# Patient Record
Sex: Male | Born: 1940 | Race: White | Hispanic: No | Marital: Married | Smoking: Former smoker
Health system: Southern US, Community
[De-identification: ages and names within clinical notes are randomized; demographics above are authoritative.]

## PROBLEM LIST (undated history)

## (undated) DIAGNOSIS — I10 Essential (primary) hypertension: Secondary | ICD-10-CM

## (undated) DIAGNOSIS — R32 Unspecified urinary incontinence: Secondary | ICD-10-CM

## (undated) DIAGNOSIS — Z87898 Personal history of other specified conditions: Secondary | ICD-10-CM

## (undated) DIAGNOSIS — M1990 Unspecified osteoarthritis, unspecified site: Secondary | ICD-10-CM

## (undated) DIAGNOSIS — IMO0001 Reserved for inherently not codable concepts without codable children: Secondary | ICD-10-CM

## (undated) DIAGNOSIS — G250 Essential tremor: Secondary | ICD-10-CM

## (undated) DIAGNOSIS — R51 Headache: Secondary | ICD-10-CM

## (undated) DIAGNOSIS — C61 Malignant neoplasm of prostate: Secondary | ICD-10-CM

## (undated) DIAGNOSIS — G4733 Obstructive sleep apnea (adult) (pediatric): Secondary | ICD-10-CM

## (undated) DIAGNOSIS — E785 Hyperlipidemia, unspecified: Secondary | ICD-10-CM

## (undated) DIAGNOSIS — IMO0002 Reserved for concepts with insufficient information to code with codable children: Secondary | ICD-10-CM

## (undated) DIAGNOSIS — I629 Nontraumatic intracranial hemorrhage, unspecified: Secondary | ICD-10-CM

## (undated) DIAGNOSIS — R600 Localized edema: Secondary | ICD-10-CM

## (undated) DIAGNOSIS — R519 Headache, unspecified: Secondary | ICD-10-CM

## (undated) HISTORY — DX: Hyperlipidemia, unspecified: E78.5

## (undated) HISTORY — DX: Essential (primary) hypertension: I10

## (undated) HISTORY — DX: Nontraumatic intracranial hemorrhage, unspecified: I62.9

## (undated) HISTORY — DX: Reserved for inherently not codable concepts without codable children: IMO0001

## (undated) HISTORY — DX: Unspecified osteoarthritis, unspecified site: M19.90

## (undated) HISTORY — DX: Reserved for concepts with insufficient information to code with codable children: IMO0002

## (undated) HISTORY — DX: Obstructive sleep apnea (adult) (pediatric): G47.33

## (undated) HISTORY — PX: OTHER SURGICAL HISTORY: SHX169

---

## 2009-05-20 ENCOUNTER — Ambulatory Visit (HOSPITAL_COMMUNITY): Admission: RE | Admit: 2009-05-20 | Discharge: 2009-05-20 | Payer: Self-pay | Admitting: Urology

## 2009-06-30 ENCOUNTER — Ambulatory Visit (HOSPITAL_COMMUNITY)
Admission: RE | Admit: 2009-06-30 | Discharge: 2009-06-30 | Payer: Self-pay | Source: Home / Self Care | Admitting: Urology

## 2010-01-18 DIAGNOSIS — I629 Nontraumatic intracranial hemorrhage, unspecified: Secondary | ICD-10-CM

## 2010-01-18 HISTORY — DX: Nontraumatic intracranial hemorrhage, unspecified: I62.9

## 2010-01-27 ENCOUNTER — Inpatient Hospital Stay (HOSPITAL_COMMUNITY)
Admission: EM | Admit: 2010-01-27 | Discharge: 2010-01-30 | Payer: Self-pay | Attending: Internal Medicine | Admitting: Internal Medicine

## 2010-01-27 ENCOUNTER — Emergency Department (HOSPITAL_COMMUNITY)
Admission: EM | Admit: 2010-01-27 | Discharge: 2010-01-27 | Payer: Self-pay | Source: Home / Self Care | Admitting: Emergency Medicine

## 2010-01-29 ENCOUNTER — Encounter (INDEPENDENT_AMBULATORY_CARE_PROVIDER_SITE_OTHER): Payer: Self-pay | Admitting: Internal Medicine

## 2010-01-30 ENCOUNTER — Ambulatory Visit: Payer: Self-pay | Admitting: Internal Medicine

## 2010-01-30 DIAGNOSIS — I4891 Unspecified atrial fibrillation: Secondary | ICD-10-CM

## 2010-02-02 LAB — DIFFERENTIAL
Basophils Absolute: 0 10*3/uL (ref 0.0–0.1)
Basophils Absolute: 0 10*3/uL (ref 0.0–0.1)
Basophils Absolute: 0 10*3/uL (ref 0.0–0.1)
Basophils Relative: 0 % (ref 0–1)
Basophils Relative: 0 % (ref 0–1)
Basophils Relative: 0 % (ref 0–1)
Eosinophils Absolute: 0 10*3/uL (ref 0.0–0.7)
Eosinophils Absolute: 0 10*3/uL (ref 0.0–0.7)
Eosinophils Absolute: 0 10*3/uL (ref 0.0–0.7)
Eosinophils Relative: 0 % (ref 0–5)
Eosinophils Relative: 0 % (ref 0–5)
Eosinophils Relative: 0 % (ref 0–5)
Lymphocytes Relative: 16 % (ref 12–46)
Lymphocytes Relative: 14 % (ref 12–46)
Lymphocytes Relative: 16 % (ref 12–46)
Lymphs Abs: 1.5 10*3/uL (ref 0.7–4.0)
Lymphs Abs: 1.2 10*3/uL (ref 0.7–4.0)
Lymphs Abs: 1.5 10*3/uL (ref 0.7–4.0)
Monocytes Absolute: 1.1 10*3/uL — ABNORMAL HIGH (ref 0.1–1.0)
Monocytes Absolute: 0.8 10*3/uL (ref 0.1–1.0)
Monocytes Absolute: 0.7 10*3/uL (ref 0.1–1.0)
Monocytes Relative: 12 % (ref 3–12)
Monocytes Relative: 8 % (ref 3–12)
Monocytes Relative: 7 % (ref 3–12)
Neutro Abs: 6.6 10*3/uL (ref 1.7–7.7)
Neutro Abs: 6.9 10*3/uL (ref 1.7–7.7)
Neutro Abs: 6.9 10*3/uL (ref 1.7–7.7)
Neutrophils Relative %: 72 % (ref 43–77)
Neutrophils Relative %: 77 % (ref 43–77)
Neutrophils Relative %: 76 % (ref 43–77)

## 2010-02-02 LAB — CBC
HCT: 42.7 % (ref 39.0–52.0)
HCT: 42.6 % (ref 39.0–52.0)
HCT: 41.2 % (ref 39.0–52.0)
Hemoglobin: 15.1 g/dL (ref 13.0–17.0)
Hemoglobin: 14.7 g/dL (ref 13.0–17.0)
Hemoglobin: 14.4 g/dL (ref 13.0–17.0)
MCH: 32.3 pg (ref 26.0–34.0)
MCH: 32.1 pg (ref 26.0–34.0)
MCH: 32.5 pg (ref 26.0–34.0)
MCHC: 35.4 g/dL (ref 30.0–36.0)
MCHC: 34.5 g/dL (ref 30.0–36.0)
MCHC: 35 g/dL (ref 30.0–36.0)
MCV: 91.2 fL (ref 78.0–100.0)
MCV: 93 fL (ref 78.0–100.0)
MCV: 93 fL (ref 78.0–100.0)
Platelets: 256 10*3/uL (ref 150–400)
Platelets: 237 10*3/uL (ref 150–400)
Platelets: 231 10*3/uL (ref 150–400)
RBC: 4.68 MIL/uL (ref 4.22–5.81)
RBC: 4.58 MIL/uL (ref 4.22–5.81)
RBC: 4.43 MIL/uL (ref 4.22–5.81)
RDW: 13.1 % (ref 11.5–15.5)
RDW: 13.1 % (ref 11.5–15.5)
RDW: 13 % (ref 11.5–15.5)
WBC: 9.2 10*3/uL (ref 4.0–10.5)
WBC: 8.9 10*3/uL (ref 4.0–10.5)
WBC: 9 10*3/uL (ref 4.0–10.5)

## 2010-02-02 LAB — BASIC METABOLIC PANEL
BUN: 12 mg/dL (ref 6–23)
BUN: 12 mg/dL (ref 6–23)
CO2: 28 mEq/L (ref 19–32)
CO2: 27 mEq/L (ref 19–32)
Calcium: 9.4 mg/dL (ref 8.4–10.5)
Calcium: 8.9 mg/dL (ref 8.4–10.5)
Chloride: 104 mEq/L (ref 96–112)
Chloride: 102 mEq/L (ref 96–112)
Creatinine, Ser: 0.98 mg/dL (ref 0.4–1.5)
Creatinine, Ser: 0.86 mg/dL (ref 0.4–1.5)
GFR calc Af Amer: >60 mL/min (ref >60)
GFR calc Af Amer: >60 mL/min (ref >60)
GFR calc non Af Amer: >60 mL/min (ref >60)
GFR calc non Af Amer: >60 mL/min (ref >60)
Glucose, Bld: 113 mg/dL — ABNORMAL HIGH (ref 70–99)
Glucose, Bld: 108 mg/dL — ABNORMAL HIGH (ref 70–99)
Potassium: 3.7 mEq/L (ref 3.5–5.1)
Potassium: 3.6 mEq/L (ref 3.5–5.1)
Sodium: 140 mEq/L (ref 135–145)
Sodium: 138 mEq/L (ref 135–145)

## 2010-02-02 LAB — COMPREHENSIVE METABOLIC PANEL
ALT: 17 U/L (ref 0–53)
AST: 17 U/L (ref 0–37)
Albumin: 3.6 g/dL (ref 3.5–5.2)
Alkaline Phosphatase: 65 U/L (ref 39–117)
BUN: 11 mg/dL (ref 6–23)
CO2: 29 mEq/L (ref 19–32)
Calcium: 9 mg/dL (ref 8.4–10.5)
Chloride: 105 mEq/L (ref 96–112)
Creatinine, Ser: 0.85 mg/dL (ref 0.4–1.5)
GFR calc Af Amer: >60 mL/min (ref >60)
GFR calc non Af Amer: >60 mL/min (ref >60)
Glucose, Bld: 121 mg/dL — ABNORMAL HIGH (ref 70–99)
Potassium: 4 mEq/L (ref 3.5–5.1)
Sodium: 141 mEq/L (ref 135–145)
Total Bilirubin: 0.6 mg/dL (ref 0.3–1.2)
Total Protein: 6.7 g/dL (ref 6.0–8.3)

## 2010-02-02 LAB — PROTIME-INR
INR: 0.94 (ref 0.00–1.49)
Prothrombin Time: 12.8 seconds (ref 11.6–15.2)

## 2010-02-02 LAB — T4, FREE: Free T4: 0.95 ng/dL (ref 0.80–1.80)

## 2010-02-02 LAB — PHOSPHORUS: Phosphorus: 3.1 mg/dL (ref 2.3–4.6)

## 2010-02-02 LAB — LIPID PANEL
Cholesterol: 195 mg/dL (ref 0–200)
HDL: 40 mg/dL (ref >39)
LDL Cholesterol: 138 mg/dL — ABNORMAL HIGH (ref 0–99)
Total CHOL/HDL Ratio: 4.9 RATIO
Triglycerides: 84 mg/dL (ref <150)
VLDL: 17 mg/dL (ref 0–40)

## 2010-02-02 LAB — HEMOGLOBIN A1C
Hgb A1c MFr Bld: 5.7 % — ABNORMAL HIGH (ref <5.7)
Mean Plasma Glucose: 117 mg/dL — ABNORMAL HIGH (ref <117)

## 2010-02-02 LAB — APTT: aPTT: 36 seconds (ref 24–37)

## 2010-02-02 LAB — TSH: TSH: 3.72 u[IU]/mL (ref 0.350–4.500)

## 2010-02-02 LAB — MAGNESIUM: Magnesium: 2.2 mg/dL (ref 1.5–2.5)

## 2010-02-02 LAB — MRSA PCR SCREENING: MRSA by PCR: NEGATIVE

## 2010-02-05 ENCOUNTER — Encounter: Payer: Self-pay | Admitting: Internal Medicine

## 2010-02-11 NOTE — Consult Note (Addendum)
NAMEGARRY, [name]                ACCOUNT NO.:  [id number]  MEDICAL RECORD NO.:  [id number]          PATIENT TYPE:  INP  LOCATION:  2610                         FACILITY:  [facility]  PHYSICIAN:  Joycelyn Schmid, MD   DATE OF BIRTH:  26-Oct-1940  DATE OF CONSULTATION: DATE OF DISCHARGE:                                CONSULTATION   CHIEF COMPLAINT/REASON FOR CONSULTATION:  Right occipital intracerebral hemorrhage, headache, visual difficulty.  HISTORY OF PRESENT ILLNESS:  A 70 year old male with no known past medical history who developed new onset right frontal headache and visual difficulty on January 26, 2010 at 9:30 in the morning.  He experienced increasingly severe right frontal headache and then noticed some abnormality of his vision.  He describes looking at objects and only being able to see certain parts of the object and having to focus and look for other clues to correctly identify them.  He gives an example of the clock on the wall in the hospital room.  He states that he can see that it is a round object on the wall.  By its location and context, he knows that it is a clock, however, he is not able to see the finer details of that and readily identified as an analogue clock on the wall.  He also notes some difficulty with reading.  The patient has had similar headache 5 or 6 years ago as well as another similar headache 5 or 6 months ago.  Each of these headaches lasted a few days.  The patient is well-educated gentleman who works as the Chartered certified accountant. He was an avid Designer, multimedia and over time had improved his skills that he was able to quickly glance the puzzle and already begin to get insight on how to solve the puzzle.  However, he "lost this ability" several months ago.  PAST MEDICAL HISTORY:  Questionable hypertension not formally diagnosed.  PAST SURGICAL HISTORY:  Arthroscopic knee surgery.  ALLERGIES:  None.  MEDICATIONS:  None.  SOCIAL HISTORY:  Two  to three beers every 2-3 weeks.  Denies tobacco or illicit drugs.  FAMILY HISTORY:  None.  REVIEW OF SYSTEMS:  As per the HPI.  He does have some mild nausea, right frontal headache.  Denies chest pain, shortness of breath, abdominal pain, constipation, diarrhea, joint pain.  Denies numbness, weakness, tingling in the extremities or face.  PHYSICAL EXAMINATION:  VITAL SIGNS:  Blood pressure ranging from 149-170 over 70-80, pulse 84, 96% room air, respirations 19.  PHYSICAL EXAMINATION:  NEUROLOGIC:  He is awake and alert.  Language is fluent, compression is intact.  He has some difficulty in visual identification of objects.  He has some element of left neglect.  NIH stroke scale could be the picture.  He readily identifies the woman washing dishes on the right side.  He takes several moments to slowly gaze towards the left side of the picture.  Interestingly, he describes the scene through the window where there is a bush and a walkway around it as looking like a person's head lying on a pillow.  He is able to read words, however,  has some difficulty with sentences with the first word of the sentence on the extreme left side of the page.  He is able to identify colors.  Cranial nerve examination:  Pupils reactive from 3- 2 mm, decreased visual field, left homonymous hemianopsia.  Extraocular muscles are intact.  Facial sensation and strength symmetric.  Uvula is midline.  Shoulders symmetric.  Tongue is midline.  Motor examination: Normal bulk and tone.  He has a mixed postural and resting tremor in the right upper extremity greater than left upper extremity.  5/5 strength in upper and lower extremities.  Sensory examination is intact to light touch.  No extinction to double simultaneous stimulation.  Reflexes are 1+ in the upper and lower extremities.  Downgoing toes.  Coordination Testing:  Finger-nose-finger, fine finger movements are rapid and symmetric.  Gait is not  assessed.  LABORATORY TESTING:  Sodium 141, BUN 11, creatinine 0.85.  PT 12.8, INR 0.94, PTT 36, platelets 237,000.  CT scan of the head which I reviewed shows a 2.5 x 3.9 cm right occipital intracerebral hemorrhage.  ASSESSMENT AND RECOMMENDATIONS:  A 70 year old male with new-onset headache and visual disturbance found to have a right occipital intracerebral hemorrhage at [facility] and transferred to [facility] for further management.  Differential diagnosis would include hypertensive intracerebral hemorrhage, underlying vascular malformation, amyloid angiopathy, or underlying structural lesion.  RECOMMENDATIONS: 1. CT scan of the head this morning to evaluate for stability of     hemorrhage. 2. MRI of the brain with and without contrast when able to do so. 3. Continue blood pressure control, goal systolic blood pressure less     than 160.     Joycelyn Schmid, MD     VP/MEDQ  D:  01/28/2010  T:  01/28/2010  Job:  [id number]  Electronically Signed by Joycelyn Schmid  on 02/11/2010 02:59:10 PM

## 2010-02-12 NOTE — H&P (Addendum)
NAMEBRONTE, [name]                ACCOUNT NO.:  [id number]  MEDICAL RECORD NO.:  [id number]          PATIENT TYPE:  EMS  LOCATION:  ED                            FACILITY:  [facility]  PHYSICIAN:  Rock Nephew, MD       DATE OF BIRTH:  1940/01/28  DATE OF ADMISSION:  01/27/2010 DATE OF DISCHARGE:  LH                             HISTORY & PHYSICAL   PRIMARY CARE PHYSICIAN:  The patient has no primary care physician.  CHIEF COMPLAINT:  Headache.  CT of the head notes him to have intracranial hemorrhage.  HISTORY OF PRESENT ILLNESS:  A 70 year old male, who has no significant past medical history, only history of hemorrhoids,  comes in with chief complaint of headache.  The patient reported that his headache started approximately 9:30 a.m. on January 25, 2010.  The patient reported that he had vision changes where he could not differentiate objects very well and had trouble walking on his computer. His headache is steadily getting worse, 8/10 intensity.  He had problems seeing the TV.  He was brought into the hospital. The patient in the hospital had a CT of the head without contrast which showed acute intraparenchymal hemorrhage right occipital lobe with extension, high attenuation, subdural blood along the falx and free edges of the right tentorium. The patient's headache is actually located in the forehead, more on the right side. The patient also reports that he has had a headache like this one time about 5-6 years ago.  He had another headache like this about 6 months ago.  PAST MEDICAL HISTORY: 1. Some headaches. 2. Hemorrhoids.  SURGICAL HISTORY:  Arthroscopic surgery right knee.  SOCIAL HISTORY:  Nonsmoker.  He drinks about two to three beers about every 2 weeks.  Does not use any drugs.  He is a retired Hydrographic surveyor.  REVIEW OF SYSTEMS:  Headaches, vision changes.  No chest pain or shortness of breath.  He has had some nausea but no vomiting.  He has no abdominal pain.  No  constipation.  No diarrhea.  No burning on urination.  He has no pain in his leg.  He has no trouble moving his arms or legs.  HOME MEDICATIONS:  None.  ALLERGIES:  No known drug allergies.  PHYSICAL EXAMINATION:  VITAL SIGNS:  Temperature 98.4, blood pressure 153/102, pulse rate 83, respiratory rate 80, 95% saturation on room air. HEAD, EYES, EARS, NOSE AND THROAT:  Normocephalic, atraumatic.  Pupils equally round, reactive to light. CARDIOVASCULAR:  S1, S2. Regular rate rhythm.  No murmurs or rubs. LUNGS:  Clear to auscultation bilaterally.  No wheezes or rhonchi. ABDOMEN:  Soft, nontender, nondistended.  Bowel sounds positive.  No guarding or rebound tenderness. EXTREMITIES:  No lower extremity edema evident. NEUROLOGIC:  The patient is alert, awake, oriented x3.  Cranial nerves II-XII grossly intact.  No focal neurological deficits appreciated.  LABORATORY STUDIES:  CT of the head shows acute intraparenchymal hemorrhage right occipital lobe with extension, high attenuation, subdural blood along the falx and free edges of the right tentorium.  WBC 9.2, hemoglobin 15.1, hematocrit 42.7, MCV 91.2, platelets 256, neutrophils  72.  INR 0.94.  PT 12.8, PTT 36.  Sodium 140, potassium 3.7, chloride 104, bicarbonate 28, BUN 12, creatinine 0.98, glucose 113, calcium 9.4.  IMPRESSION AND PLAN:  A 70 year old male admitted for a spontaneous intracranial hemorrhage: 1. Spontaneous intracranial hemorrhage, etiology not clear.  The     patient will be admitted to step-down bed at [facility]     per recommendation of Dr. Joycelyn Schmid with whom I have spoken.     The patient will get neurologic checks every 2 hours.  The patient     also will get a noncontrast head CT tomorrow morning to look at the     evolution of the hemorrhage.  The patient will get labetalol 10 mg     IV q.1 h p.r.n. systolic blood pressure greater than 160 or     diastolic blood pressure greater than 110 as  those are the current     blood pressure goals for the patient. 2. Hypertension.  The patient is slightly hypertensive, most likely     physiologic after the hemorrhage and also secondary to the headache     and pain. 3. Headache.  Again, headache is most likely from the intracranial     hemorrhage.  The patient will get oxycodone, morphine p.r.n., and     Dilaudid p.r.n. for the pain. 4. Deep vein thrombosis prophylaxis.  The patient will be placed on     SCDs.  CODE STATUS:  I discussed code status with the patient, and the patient wishes to be a full code.  Again, when the patient arrives to [facility], [facility] hospitalists as well as [facility] will be notified  Please note, this is not a final document until it is electronically signed.     Rock Nephew, MD     NH/MEDQ  D:  01/27/2010  T:  01/27/2010  Job:  [id number]  Electronically Signed by Rock Nephew MD on 02/12/2010 10:20:29 AM

## 2010-02-17 NOTE — Discharge Summary (Signed)
[name], [name]                ACCOUNT NO.:  [id number]  MEDICAL RECORD NO.:  [id number]          PATIENT TYPE:  INP  LOCATION:  3028                         FACILITY:  [facility]  PHYSICIAN:  Lonia Blood, M.D.DATE OF BIRTH:  03-23-1940  DATE OF ADMISSION:  01/27/2010 DATE OF DISCHARGE:  01/30/2010                        DISCHARGE SUMMARY - REFERRING   PRIMARY NEUROLOGIST PRIOR TO ADMISSION:  Dr. Josie Dixon with [facility].  NEUROLOGIST THIS ADMISSION:  Dr. Delia Heady.  DISCHARGING PHYSICIAN:  Dr. Jetty Duhamel.  CHIEF COMPLAINT/REASON FOR ADMISSION:  [name] is a 70 year old male patient who presented to the ER with complaint of severe headache associated with visual disturbances.  This was associated with some nausea without vomiting.  CT of the head done in [place] at [facility] demonstrated an acute intraparenchymal hemorrhage in the right occipital lobe with extension, high attenuation, subdural blood along the falx and free edges of the right tentorium.  The patient's headache was located in the frontal region in the forehead, more so on the right side.  Patient also endorses that he has had similar headaches about 5 to 6 years ago and has had headaches like this about 6 months ago.  Apparently, because of the headaches, he was already set to see a neurologist in [place].  He has also had some issues with tremor in 1 extremity and some neck numbness that apparently has been felt to be due to cervical nerve root compression.  On clinical exam, his temperature was 98.4, BP was elevated at 153/102, pulse rate 83, respirations 18, O2 saturations 95% on room air. Physical exam was unremarkable from a neurological standpoint.  Patient was alert, awake, and oriented x3.  Cranial nerves 2-12 were grossly intact.  No focal neurological deficits appreciated.  No funduscopic exam was performed.  CT of the head again demonstrated the acute  right occipital lobe bleed with a tiny subdural hematoma along the falx and free edges of the right tentorium.  His white count was 9200, hemoglobin 15.1, hematocrit 42.7, platelets 257,000, INR 0.94, PT 12.8, PTT 36, sodium 140, potassium 3.7, chloride 104, bicarbonate 28, BUN 12, creatinine 0.98, glucose 113, calcium 9.4.  PAST MEDICAL HISTORY: 1. Headaches in the past, etiology unclear. 2. Obstructive sleep apnea, intolerant to CPAP. 3. Apparent previous diagnosis of hypertension, currently not on     medications. 4. History of constipation and prior hemorrhoids. 5. Chronic joint pain and most likely osteoarthritis.  ADMITTING DIAGNOSES: 1. Spontaneous intracranial hemorrhage, etiology not clear. 2. Uncontrolled hypertension. 3. Headache, most likely due to intracranial hemorrhage. 4. History of sleep apnea, currently not on CPAP due to prior     intolerance to CPAP mask.  DIAGNOSTICS: 1. CT of the head without contrast, January the 10th, shows acute     intraperitoneal hemorrhage of the right occipital lobe with     extension of high attenuation subdural blood along the falx and the     free edge of the right tentorium. 2. CT of the head without contrast, January the 11th, shows no change     in appearance of the right occipital intraparenchymal  hematoma with     estimated volume of 16 mL.  There was mild vasogenic edema.  No     significant mass effect.  Small amount of subdural blood along the     right falx and tentorium. 3. MRI of the brain with and without contrast on January the 11th that     showed right occipital hematoma to be unchanged.  In addition there     are several small adjacent punctate areas of hemorrhage and a very     small right parietal subdural hematoma.  This raises the     possibility of traumatic hemorrhage.  Other possibilities would     include hypertension and amyloid angiopathy.  There was also found     to be atrophy and chronic microvascular  ischemia without acute     infarct. 4. A 2D echocardiogram, January the 12th, shows normal wall thickness     with moderate LVH, systolic function normal, estimated ejection     fraction between 55% and 60%, left atrium was mildly dilated.  No     pulmonary hypertension.  No right-sided heart failure or heart     enlargement or systolic dysfunction on the right.  LABORATORY:  MRSA PCR screening was negative.  TSH 3.720, free T4 0.95. Hemoglobin A1c 5.7.  Cholesterol 195, triglycerides 84, HDL 40, LDL 138. On January the 12th, sodium 138, potassium 3.6, chloride 102, CO2 of 27, glucose 108, BUN 12, creatinine 0.86, hemoglobin 14.4, hematocrit 41.2, platelet count 231 thousand, white count 9000 with a neutrophil count of 76%.  HOSPITAL COURSE: 1. Acute right occipital hemorrhage.  Patient was transferred from     [facility] to [facility] for further acute intracranial     bleed management.  Dr. Marjory Lies with Neurology Services was     consulted on January the 11th and evaluated the patient.  His     recommendations were to repeat a CT scan of the head to ensure that     the initial bleed was stable which it was.  Also, an MRI of the     brain with and without contrast was recommended as well and goal     systolic blood pressure range less than 160.  Subsequently, Dr.     Pearlean Brownie assumed care of the patient.  Patient remained stable without     any focal motor neurological deficits.  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  His prior     headache and nausea have resolved.  He will probably benefit from     outpatient occupational therapy most likely in a home health     setting initially.  In discussion with Occupational Therapy,     consideration should be given to outpatient referral to a     neurological ophthalmologist if the agnosia and visual problems do     not resolve in several weeks as the bleeding should be  resolving.     In addition, Dr. Pearlean Brownie recommends outpatient telemetry monitoring     for 3 weeks to ensure that he did not have any asymptomatic     undiagnosed atrial fibrillation which contributed to the stroke i.e.     this is actually a hemorrhagic conversion of embolic stroke. 2. Uncontrolled hypertension.  Patient apparently had a history of     hypertension but was not on medications prior to admission.  He has  subsequently been placed on hydrochlorothiazide with moderate     control of blood pressure.  Patient has been instructed on the     importance of managing hypertension and taking medications as     prescribed. 3. Headache and nausea.  During the first 24 to 48 hours of     hospitalization, patient had a significant headache which caused     associated nausea.  To prevent emesis and further increases in     intracranial pressure, patient had been placed on scheduled Zofran.     As of today, his nausea has resolved.  He will be given a     prescription of Zofran in the event he needs this to treat nausea. 4. Dyslipidemia.  Patient was on fish oil prior to admission.  His LDL     cholesterol was elevated at 138 and in the setting of hypertension     and stroke goal should be less than 100.  He was started on Zetia     by the neurological services.  A statin was not utilized because of     increased risk for bleeding in the setting of intracranial     hemorrhage. 5. Sleep apnea.  Patient has a diagnosis of sleep apnea.  There was an     attempt made to obtain a smaller CPAP mask to see if patient could     tolerate.  Unfortunately, this equipment was not delivered to the     patient.  Patient reports he will follow up his primary care     physician and discuss the issues related to sleep apnea after     discharge.  Please note that the echocardiogram showed no evidence     of right ventricular hypertrophy or right heart failure or     pulmonary hypertension. 6. Chronic  constipation.  Patient has not had a bowel movement since     admission.  Current constipation is a problem.  We have started     Colace this admission and with the addition of Zetia suspect that     patient's constipation should improve. 7. Chronic joint pain, suspected osteoarthritis.  Patient regularly     took NSAIDs prior to admission.  Due to presentation with     hypertensive bleed, we have discontinued NSAIDs.  He was already on     Ultram this admission to treat headache and this should help with     treatment of arthritic pain.  In addition, he has been instructed     to use over-the-counter Tylenol as well.  FINAL DISCHARGE DIAGNOSES: 1. Acute right occipital hemorrhage with small right parietal subdural     hematoma, stable. 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 3. Hypertension, now moderately controlled on medication. 4. Headache and nausea, resolved. 5. Dyslipidemia with elevated LDL cholesterol, new start on Zetia this     admission. 6. Obstructive sleep apnea, not on CPAP secondary to prior intolerance     to a mask. 7. Chronic constipation. 8. Chronic joint pain and osteoarthritis.  DISCHARGE MEDICATIONS: 1. Tylenol 325 mg 1 to 2 tablets every 4 hours as needed for pain or     fever. 2. Colace 100 mg by mouth daily.  This medication is available over     the counter. 3. Zetia 10 mg by mouth daily. 4. Hydrochlorothiazide 25 mg by mouth daily. 5. Zofran 4 mg by mouth every 4 hours as needed for nausea. 6. Ultram 50-mg tablets 100 mg  or 2 tablets every 4 hours as needed     for pain. 7. Fish oil over the counter 3 capsules by mouth daily.  Stop taking the following medications: Aleve 220 mg 2 tablets by mouth every 12 hours as needed for pain.  OTHER DISCHARGE INSTRUCTIONS:  PHYSICAL ACTIVITY/REHABILITATION:  Increase activity slowly.  No driving due to visual disturbances.  This has been explained to the patient and he verbalizes  understanding.  THERAPIES:  Occupational therapy recommended either in the home health settings or at a freestanding facility.  DIET AND WEIGHT:  Heart-healthy diet.  Current height is 5 foot 10 with a weight of 219 pounds.  Recommended weight between 132 and 174 pounds. Patient has undergone nutritional counseling this admission and information has been given.  FOLLOWUP APPOINTMENTS: 1. Follow up with your primary physician in 1 month. 2. Keep your appointment with Dr. Josie Dixon in [place] as previously     scheduled. 3. Follow up with Dr. Pearlean Brownie in 2 months.  You need to call Dr. Marlis Edelson     office to arrange this appointment, telephone number [phone]. 4. Outpatient telemetry monitoring to be arranged by case management     for a total of 3 weeks to rule out underlying atrial fibrillation.  ADDITIONAL INFORMATION:  I had a long discussion with the patient.  He lives in [place] with his wife who is wheelchair bound and does not drive.  He has been restricted from driving because of the visual issues related to the occipital stroke.  He verbalizes that he has appropriate social contacts in place to assist with arrangement of transportation to needed physician appointments and to obtain groceries and other needs. We have also asked case management to determine if there are any other services available in the [place] area regarding transportation and assistance.  Patient does not have transportation home and I have also asked social work to arrange for a ride home to [place].  In addition, patient has an outpatient appointment set up with Dr. Josie Dixon in [place].  He has requested compact disc copies of all of his radiological reports including x-ray, CTs, and MRIs so they can accompany him to this appointment in the next few weeks.     Allison L. Rennis Harding, N.P.   ______________________________ Lonia Blood, M.D.    ALE/MEDQ  D:  01/30/2010  T:   01/30/2010  Job:  [id number]  cc:   Pramod P. Pearlean Brownie, MD Josie Dixon, M.D. Lonia Blood, M.D.  Electronically Signed by Junious Silk N.P. on 01/30/2010 03:37:09 PM Electronically Signed by Jetty Duhamel M.D. on 02/17/2010 09:47:48 AM

## 2010-03-17 NOTE — Procedures (Signed)
Summary: Summary Report  Summary Report   Imported By: Erle Crocker 03/13/2010 16:26:51  _____________________________________________________________________  External Attachment:    Type:   Image     Comment:   External Document

## 2010-05-29 ENCOUNTER — Ambulatory Visit (INDEPENDENT_AMBULATORY_CARE_PROVIDER_SITE_OTHER): Payer: Medicare Other | Admitting: Urology

## 2010-05-29 DIAGNOSIS — N138 Other obstructive and reflux uropathy: Secondary | ICD-10-CM

## 2010-05-29 DIAGNOSIS — R972 Elevated prostate specific antigen [PSA]: Secondary | ICD-10-CM

## 2010-05-29 DIAGNOSIS — N401 Enlarged prostate with lower urinary tract symptoms: Secondary | ICD-10-CM

## 2010-11-12 ENCOUNTER — Encounter: Payer: Self-pay | Admitting: Cardiology

## 2010-11-18 ENCOUNTER — Encounter: Payer: Self-pay | Admitting: Cardiology

## 2010-11-18 ENCOUNTER — Ambulatory Visit (INDEPENDENT_AMBULATORY_CARE_PROVIDER_SITE_OTHER): Payer: Medicare Other | Admitting: Cardiology

## 2010-11-18 DIAGNOSIS — E782 Mixed hyperlipidemia: Secondary | ICD-10-CM

## 2010-11-18 DIAGNOSIS — I10 Essential (primary) hypertension: Secondary | ICD-10-CM

## 2010-11-18 DIAGNOSIS — R9431 Abnormal electrocardiogram [ECG] [EKG]: Secondary | ICD-10-CM

## 2010-11-18 NOTE — Assessment & Plan Note (Signed)
Recent lipid numbers reviewed, LDL under 100. HDL also looks good. He is on statin therapy.

## 2010-11-18 NOTE — Assessment & Plan Note (Signed)
No changes made to present regimen. Continue follow up with Dr. Modesto Charon. Aim for systolic blood pressure under 161.

## 2010-11-18 NOTE — Patient Instructions (Signed)
Your physician has requested that you have en exercise stress myoview. For further information please visit [email or web address] Please follow instruction sheet, as given.  Your physician recommends that you continue on your current medications as directed. Please refer to the Current Medication list given to you today.  Your physician recommends that you schedule a follow-up appointment in: we will contact you with test results.

## 2010-11-18 NOTE — Progress Notes (Signed)
Clinical Summary Mr. Lembke is a 70 y.o.male referred for cardiology consultation by Dr. Modesto Charon. He states that he has been trying to make healthier lifestyle decisions, losing weight, and exercising more regularly. Chronic medical illnesses and risk factors are outlined below. He reports a stress test approximately 5 years ago at a facility in [place] that was reportedly normal. He has not undergone any subsequent cardiac evaluation other than an echocardiogram in January of this year that showed an LVEF of 55-60% with moderate LVH and mildly dilated left atrium. No PFO described.  Most recent major issue was a spontaneous intracranial bleed documented back in January. He follows with a neurologist in [place]. Records indicate that he did wear a cardiac monitor subsequent to this that did not demonstrate any atrial fibrillation. Medical regimen is reviewed.  Recent lab work showed potassium 4.5, BUN 19, creatinine 0.9, AST 17, ALT 13, LDL 99, HDL 56, triglycerides 47, cholesterol 164.  We reviewed his Framingham ten-year risk of 16% placing him in an intermediate risk category. He is interested in advancing his exercise regimen.  No Known Allergies  Medication list reviewed.  Past Medical History  Diagnosis Date  . Chronic headaches   . Obstructive sleep apnea     Compliant with CPAP  . Essential hypertension, benign   . Osteoarthritis   . Hyperlipidemia   . Chronic constipation   . Intracranial hemorrhage 1/12    Acute right occipital hemorrhage with small right parietal subdural hematoma  . Hemorrhoids     Past Surgical History  Procedure Date  . Arthroscopic right knee surgery     Family History  Problem Relation Age of Onset  . Hypertension      Social History Mr. Kolenda reports that he has quit smoking. His smoking use included Cigarettes. He has never used smokeless tobacco. Mr. Agustin reports that he drinks alcohol.  Review of Systems As outlined above, occasional  headaches. No reported vision changes or focal motor weakness. Stable appetite. Otherwise negative.  Physical Examination Filed Vitals:   11/18/10 1431  BP: 143/88  Pulse: 78  Resp: 16   Overweight male in no acute distress. HEENT: Conjunctiva and lids normal, oropharynx with moist mucosa. Neck: Supple, no elevated JVP or carotid bruits, no thyromegaly. Lungs: Clear to auscultation, nonlabored. Cardiac: Regular rate and rhythm, no significant murmur or S3 gallop. Abdomen: Soft, no bruits, nontender, bowel sounds present. Skin: Warm and dry. Musculoskeletal: Arthritic deformities noted in the fingers. Extremities: No pitting edema, distal pulses full. Neuropsychiatric: Alert and oriented x3, affect appropriate.  ECG Sinus rhythm with LAFB.    Problem List and Plan

## 2010-11-18 NOTE — Assessment & Plan Note (Signed)
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 He reports no personal history of CAD or myocardial infarction. Risk factor profile is reviewed, with Framingham 10 year risk of 16%, overall intermediate risk. He is anticipating a continued exercise regimen, increasing his intensity. In light of these findings, plan is to proceed with an exercise Myoview. If reassuring, would pursue a course of risk factor modification, diet, and exercise. Otherwise, if more concerning abnormalities are noted, we can discuss the situation further.

## 2010-11-19 ENCOUNTER — Ambulatory Visit: Payer: Medicare Other | Admitting: Cardiology

## 2010-11-20 ENCOUNTER — Encounter (HOSPITAL_COMMUNITY)
Admission: RE | Admit: 2010-11-20 | Discharge: 2010-11-20 | Disposition: A | Discharge status: Home / Self Care | Payer: Medicare Other | Source: Ambulatory Visit | Attending: Cardiology | Admitting: Cardiology

## 2010-11-20 ENCOUNTER — Encounter (HOSPITAL_COMMUNITY): Payer: Self-pay | Admitting: Cardiology

## 2010-11-20 ENCOUNTER — Ambulatory Visit (INDEPENDENT_AMBULATORY_CARE_PROVIDER_SITE_OTHER): Payer: Medicare Other | Admitting: *Deleted

## 2010-11-20 ENCOUNTER — Encounter (HOSPITAL_COMMUNITY): Payer: Self-pay

## 2010-11-20 DIAGNOSIS — R9431 Abnormal electrocardiogram [ECG] [EKG]: Secondary | ICD-10-CM

## 2010-11-20 DIAGNOSIS — I10 Essential (primary) hypertension: Secondary | ICD-10-CM | POA: Insufficient documentation

## 2010-11-20 DIAGNOSIS — E782 Mixed hyperlipidemia: Secondary | ICD-10-CM | POA: Insufficient documentation

## 2010-11-20 MED ORDER — TECHNETIUM TC 99M TETROFOSMIN IV KIT
10.0000 | PACK | Freq: Once | INTRAVENOUS | Status: AC | PRN
  Administered 2010-11-20: 10.5 via INTRAVENOUS

## 2010-11-20 MED ORDER — TECHNETIUM TC 99M TETROFOSMIN IV KIT
30.0000 | PACK | Freq: Once | INTRAVENOUS | Status: AC | PRN
  Administered 2010-11-20: 29 via INTRAVENOUS

## 2010-11-20 NOTE — Progress Notes (Signed)
Stress Lab Nurses Notes - [name]  [name] 11/20/2010  Reason for doing test: Abnormal EKG  Multi. risk factors  Type of test: Stress Myoview  Nurse performing test: Parke Poisson, RN  Nuclear Medicine Tech: Lou Cal  Echo Tech: Not Applicable  MD performing test: R. Rothbart  Family MD: Modesto Charon  Test explained and consent signed: yes  IV started: 22g jelco, Saline lock flushed, No redness or edema and Saline lock started in radiology  Symptoms: fatigue  Treatment/Intervention: None  Reason test stopped: fatigue and reached target HR  After recovery IV was: Discontinued via X-ray tech and No redness or edema  Patient to return to Nuc. Med at : 12:20  Patient discharged: Home  Patient's Condition upon discharge was: stable  Comments: During test peak BP 182/70 & HR 142 .  Recovery BP 140/68 & HR 82.  Symptoms resolved in recovery.  [name] T

## 2010-12-04 ENCOUNTER — Ambulatory Visit (INDEPENDENT_AMBULATORY_CARE_PROVIDER_SITE_OTHER): Payer: Medicare Other | Admitting: Urology

## 2010-12-04 DIAGNOSIS — R972 Elevated prostate specific antigen [PSA]: Secondary | ICD-10-CM

## 2010-12-04 DIAGNOSIS — N401 Enlarged prostate with lower urinary tract symptoms: Secondary | ICD-10-CM

## 2010-12-04 DIAGNOSIS — N138 Other obstructive and reflux uropathy: Secondary | ICD-10-CM

## 2011-06-04 ENCOUNTER — Ambulatory Visit (INDEPENDENT_AMBULATORY_CARE_PROVIDER_SITE_OTHER): Payer: Medicare Other | Admitting: Urology

## 2011-06-04 DIAGNOSIS — N401 Enlarged prostate with lower urinary tract symptoms: Secondary | ICD-10-CM

## 2011-06-04 DIAGNOSIS — R972 Elevated prostate specific antigen [PSA]: Secondary | ICD-10-CM

## 2011-06-04 DIAGNOSIS — N138 Other obstructive and reflux uropathy: Secondary | ICD-10-CM

## 2011-12-03 ENCOUNTER — Ambulatory Visit (INDEPENDENT_AMBULATORY_CARE_PROVIDER_SITE_OTHER): Payer: Medicare Other | Admitting: Urology

## 2011-12-03 DIAGNOSIS — R972 Elevated prostate specific antigen [PSA]: Secondary | ICD-10-CM

## 2011-12-03 DIAGNOSIS — N401 Enlarged prostate with lower urinary tract symptoms: Secondary | ICD-10-CM

## 2011-12-03 DIAGNOSIS — R3129 Other microscopic hematuria: Secondary | ICD-10-CM

## 2011-12-03 DIAGNOSIS — N138 Other obstructive and reflux uropathy: Secondary | ICD-10-CM

## 2012-06-16 ENCOUNTER — Ambulatory Visit (INDEPENDENT_AMBULATORY_CARE_PROVIDER_SITE_OTHER): Payer: Medicare Other | Admitting: Urology

## 2012-06-16 DIAGNOSIS — R972 Elevated prostate specific antigen [PSA]: Secondary | ICD-10-CM

## 2012-06-24 ENCOUNTER — Other Ambulatory Visit: Payer: Self-pay | Admitting: Family Medicine

## 2012-07-29 ENCOUNTER — Other Ambulatory Visit: Payer: Self-pay | Admitting: Family Medicine

## 2012-12-29 ENCOUNTER — Ambulatory Visit (INDEPENDENT_AMBULATORY_CARE_PROVIDER_SITE_OTHER): Payer: Medicare Other | Admitting: Urology

## 2012-12-29 ENCOUNTER — Encounter (INDEPENDENT_AMBULATORY_CARE_PROVIDER_SITE_OTHER): Payer: Self-pay

## 2012-12-29 DIAGNOSIS — R3129 Other microscopic hematuria: Secondary | ICD-10-CM

## 2012-12-29 DIAGNOSIS — N401 Enlarged prostate with lower urinary tract symptoms: Secondary | ICD-10-CM

## 2012-12-29 DIAGNOSIS — R972 Elevated prostate specific antigen [PSA]: Secondary | ICD-10-CM

## 2012-12-29 DIAGNOSIS — N138 Other obstructive and reflux uropathy: Secondary | ICD-10-CM

## 2013-02-13 ENCOUNTER — Other Ambulatory Visit: Payer: Self-pay | Admitting: Urology

## 2013-02-13 DIAGNOSIS — C61 Malignant neoplasm of prostate: Secondary | ICD-10-CM

## 2013-02-22 ENCOUNTER — Encounter (HOSPITAL_COMMUNITY)
Admission: RE | Admit: 2013-02-22 | Discharge: 2013-02-22 | Disposition: A | Discharge status: Home / Self Care | Payer: Medicare Other | Source: Ambulatory Visit | Attending: Urology | Admitting: Urology

## 2013-02-22 DIAGNOSIS — C61 Malignant neoplasm of prostate: Secondary | ICD-10-CM | POA: Insufficient documentation

## 2013-02-22 MED ORDER — TECHNETIUM TC 99M MEDRONATE IV KIT
25.0000 | PACK | Freq: Once | INTRAVENOUS | Status: AC | PRN
  Administered 2013-02-22: 26.5 via INTRAVENOUS

## 2013-03-08 ENCOUNTER — Other Ambulatory Visit: Payer: Self-pay | Admitting: Urology

## 2013-03-08 ENCOUNTER — Ambulatory Visit (HOSPITAL_COMMUNITY)
Admission: RE | Admit: 2013-03-08 | Discharge: 2013-03-08 | Disposition: A | Discharge status: Home / Self Care | Payer: Medicare Other | Source: Ambulatory Visit | Attending: Urology | Admitting: Urology

## 2013-03-08 DIAGNOSIS — C61 Malignant neoplasm of prostate: Secondary | ICD-10-CM

## 2013-03-08 DIAGNOSIS — M5137 Other intervertebral disc degeneration, lumbosacral region: Secondary | ICD-10-CM | POA: Insufficient documentation

## 2013-03-08 DIAGNOSIS — R948 Abnormal results of function studies of other organs and systems: Secondary | ICD-10-CM | POA: Insufficient documentation

## 2013-03-08 DIAGNOSIS — M47814 Spondylosis without myelopathy or radiculopathy, thoracic region: Secondary | ICD-10-CM | POA: Insufficient documentation

## 2013-03-08 DIAGNOSIS — M51379 Other intervertebral disc degeneration, lumbosacral region without mention of lumbar back pain or lower extremity pain: Secondary | ICD-10-CM | POA: Insufficient documentation

## 2013-03-12 ENCOUNTER — Ambulatory Visit: Payer: Medicare Other

## 2013-03-12 ENCOUNTER — Ambulatory Visit: Payer: Medicare Other | Admitting: Radiation Oncology

## 2013-03-13 ENCOUNTER — Encounter: Payer: Self-pay | Admitting: Radiation Oncology

## 2013-03-13 NOTE — Progress Notes (Signed)
GU Location of Tumor / Histology: prostate adenocarcinoma  If Prostate Cancer, Gleason Score is (3 + 4=7 and 3+3=6) and PSA is (14.71 on 12/23/12) and prostate volume is 77 cc  Patient presented with signs/symptoms of: an elevated PSA of 10.32.  Biopsies of prostate (if applicable) revealed:     Past/Anticipated interventions by urology, if any: prostate biopsy 02/07/13  Past/Anticipated interventions by medical oncology, if any: none  Weight changes, if any: no  Bowel/Bladder complaints, if any: weak urinary stream occasionally, gets up 2 times per night to urinate   Nausea/Vomiting, if any: no  Pain issues, if any:  no  SAFETY ISSUES:  Prior radiation? No  Pacemaker/ICD? no  Possible current pregnancy? no  Is the patient on methotrexate? no  Current Complaints / other details:  Married, two children.  IPSS score of 10.

## 2013-03-14 ENCOUNTER — Ambulatory Visit
Admission: RE | Admit: 2013-03-14 | Discharge: 2013-03-14 | Disposition: A | Discharge status: Home / Self Care | Payer: Medicare Other | Source: Ambulatory Visit | Attending: Radiation Oncology | Admitting: Radiation Oncology

## 2013-03-14 ENCOUNTER — Encounter: Payer: Self-pay | Admitting: Radiation Oncology

## 2013-03-14 VITALS — BP 132/87 | HR 84 | Temp 98.1°F | Ht 70.0 in | Wt 204.6 lb

## 2013-03-14 DIAGNOSIS — C61 Malignant neoplasm of prostate: Secondary | ICD-10-CM

## 2013-03-14 DIAGNOSIS — Z87891 Personal history of nicotine dependence: Secondary | ICD-10-CM | POA: Insufficient documentation

## 2013-03-14 DIAGNOSIS — M1990 Unspecified osteoarthritis, unspecified site: Secondary | ICD-10-CM | POA: Insufficient documentation

## 2013-03-14 DIAGNOSIS — K649 Unspecified hemorrhoids: Secondary | ICD-10-CM | POA: Insufficient documentation

## 2013-03-14 DIAGNOSIS — Z79899 Other long term (current) drug therapy: Secondary | ICD-10-CM | POA: Insufficient documentation

## 2013-03-14 DIAGNOSIS — E785 Hyperlipidemia, unspecified: Secondary | ICD-10-CM | POA: Insufficient documentation

## 2013-03-14 DIAGNOSIS — G4733 Obstructive sleep apnea (adult) (pediatric): Secondary | ICD-10-CM | POA: Insufficient documentation

## 2013-03-14 DIAGNOSIS — I10 Essential (primary) hypertension: Secondary | ICD-10-CM | POA: Insufficient documentation

## 2013-03-14 HISTORY — DX: Malignant neoplasm of prostate: C61

## 2013-03-14 NOTE — Progress Notes (Signed)
Please see the Nurse Progress Note in the MD Initial Consult Encounter for this patient. 

## 2013-03-14 NOTE — Progress Notes (Signed)
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 male who is seen out of the courtesy of Dr. Bjorn Pippin for an opinion concerning radiation therapy as part of management of patient's recently diagnosed prostate cancer. Patient is been noted to have a elevated PSA over the past several years. He initially declined biopsy. PSA had increased to 14.72 most recently from a PSA of 10.3 to a year ago.  the patient agreed to proceed with transrectal ultrasound and biopsy. On ultrasound the prostate was enlarged at 77 cubic centimeters. He was noted to have 2 hypoechoic lesions noted in the right mid transitional zone posteriorly and the left apical area.   the first lesion measuring 1 x 2 cm and the second 1 x 0.5 cm. The seminal vesicles appeared normal on ultrasound. A standard 12 core biopsy pattern was performed with 8/12 cores being positive. 3/8 biopsy showed Gleason 7 (3+4). 5/8 biopsy showed Gleason's 6 disease. Patient proceeded to undergo a bone scan showing no osseous metastasis. In light of the patient's age and medical issues he is not felt to be an ideal candidate for radical prostatectomy and felt to be at high risk for recurrence given this treatment approach.  He is also not felt to be a good candidate for watchful waiting light of his pathologic findings and relatively young age. Radiation therapy is been consulted for consideration for treatments.Marland Kitchen  PREVIOUS RADIATION THERAPY: No  PAST MEDICAL HISTORY:  has a past medical history of Obstructive sleep apnea; Essential hypertension, benign;  Osteoarthritis; Hyperlipidemia; Intracranial hemorrhage (1/12); Hemorrhoids; and Prostate cancer.    PAST SURGICAL HISTORY: Past Surgical History  Procedure Laterality Date  . Arthroscopic right knee surgery      FAMILY HISTORY: family history includes Cancer in his paternal uncle; Hypertension in an other family member.  SOCIAL HISTORY:  reports that he quit smoking about 8 years ago. His smoking use included Cigarettes. He has a 15 pack-year smoking history. He has never used smokeless tobacco. He reports that he does not drink alcohol or use illicit drugs.  ALLERGIES: Review of patient's allergies indicates no known allergies.  MEDICATIONS:  Current Outpatient Prescriptions  Medication Sig Dispense Refill  . amLODipine (NORVASC) 5 MG tablet Take 5 mg by mouth daily.        Marland Kitchen atorvastatin (LIPITOR) 10 MG tablet Take 10 mg by mouth daily.        Marland Kitchen losartan-hydrochlorothiazide (HYZAAR) 100-25 MG per tablet Take 1 tablet by mouth daily.         No current facility-administered medications for this encounter.    REVIEW OF SYSTEMS:  A 15 point review of systems is documented in the electronic medical record. This was obtained by the nursing staff. However, I reviewed this with the patient to discuss relevant findings and make appropriate changes.  The patient completed the international prostate symptom score showing a total score of 10 representing mild to moderate symptomatology. Most significant score  was in weak urinary stream. Patient does have significant bilateral hip discomfort upon awakening but this resolves over the day. He does have erectile dysfunction but this does not seem to be an issue as his wife is in a wheelchair with other medical issues.   PHYSICAL EXAM:  height is 5\' 10"  (1.778 m) and weight is 204 lb 9.6 oz (92.806 kg). His temperature is 98.1 F (36.7 C). His blood pressure is 132/87 and his pulse is 84.  a detailed physical examination was not performed  today.   ECOG = 0  0 - Asymptomatic (Fully active, able to carry on all predisease activities without restriction)   LABORATORY DATA: PSA 14.71 12/22/2012   RADIOGRAPHY: Dg Thoracic Spine 2 View  03/08/2013   CLINICAL DATA:  Abnormal bone scan, prostate cancer  EXAM: THORACIC SPINE - 2 VIEW  COMPARISON:  None.  FINDINGS: Three views of thoracic spine submitted. No acute fracture or subluxation. Degenerative changes are noted lower thoracic spine with anterior and lateral spurring. These correspond to mild increased activity on bone scan. No destructive bony lesions are noted.  IMPRESSION: No acute fracture or subluxation. Degenerative changes lower thoracic spine corresponding to bone scan abnormality.   Electronically Signed   By: Lahoma Crocker M.D.   On: 03/08/2013 14:50   Dg Lumbar Spine Complete  03/08/2013   CLINICAL DATA:  Reason abnormalities in the lower thoracic and upper lumbar spine on nuclear bone scan.  EXAM: LUMBAR SPINE - COMPLETE 4+ VIEW  COMPARISON:  NM BONE WHOLE BODY dated 02/22/2013  FINDINGS: The lumbar vertebral bodies are preserved in height. There is disc space narrowing at L4-5. There is a large right lateral osteophyte at L1-2 with smaller bilateral osteophytes at L2-3. In the lower thoracic spine left lateral osteophytes are noted at T11-T12 and T12-L1. The pedicles within the lumbar spine appear intact. There is no pars defect. There is facet joint degenerative change diffusely in the lumbar spine.  IMPRESSION: There are degenerative changes manifested by disc space narrowing in the lower lumbar spine as well as diffuse facet joint sclerosis and narrowing. There also exuberant lateral osteophytes in the lower thoracic and upper lumbar spine. No plain film findings are seen to suggest primary or metastatic malignancy to the bones. Signed report   Electronically Signed   By: David  Martinique   On: 03/08/2013 14:42   Nm Bone Scan Whole Body  02/22/2013   CLINICAL DATA:  Prostate  cancer.  EXAM: NUCLEAR MEDICINE WHOLE BODY BONE SCAN  TECHNIQUE: Whole body anterior and posterior images were obtained approximately 3 hours after intravenous injection of radiopharmaceutical.  COMPARISON:  None.  RADIOPHARMACEUTICALS:  26.5 mCi Technetium-99 MDP  FINDINGS: Bilateral renal function and excretion. Increased activity noted of the medial right knee joint, this is most likely degenerative. Similar findings noted at the base of the first metacarpals bilaterally. Mild increase activity noted at the T12-L1 regions, most likely degenerative. Correlation with thoracic and lumbar spine series suggested.  IMPRESSION: 1. Findings suggesting degenerative changes right knee and at base of both first metacarpals. 2. Mild increased activity noted in the lower thoracic Tom upper lumbar spine, most likely degenerative change. Correlation with plain films suggested.   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 I discussed with the patient that I would consider him intermediate risk. With his relatively young age  and overall health I would not feel comfortable with active surveillance in the situation.  Patient would be a candidate for radical prostatectomy but with high likelihood of recurrence given his pathologic findings. I do feel the patient would be a good candidate for external beam radiation therapy and would also agree with Dr. Ralene Muskrat recommendation for short-term adjuvant androgen ablation. I discussed with the patient that he would have two Gen. radiation therapy options,  one being 8 weeks of IMRT versus 5 weeks of 3-D conformal followed by a radiation seed boost. Patient did inform me that he may be moving to the [place] part [place] the [place] later this spring if a job offer comes through. In this case I would not recommend that the patient proceed with his radiation therapy until he knows  his permanent residence. In the meantime the patient could proceed with his short course of adjuvant androgen ablation. This would help to shrink his prostate so that if he decides on a radioactive seed boost this may be an option for him. At This time the patient is undecided whether he will proceed with treatment at which treatment approach. He would like to discuss details with his wife prior to making a final decision. I've asked him to call myself or Dr. Jeffie Pollock once he has made his final decision concerning treatment.   PLAN: We will await the patient's decision concerning treatment  I spent 60 minutes minutes face to face with the patient and more than 50% of that time was spent in counseling and/or coordination of care.   ------------------------------------------------  -----------------------------------  Blair Promise, PhD, MD

## 2013-06-19 ENCOUNTER — Encounter: Payer: Self-pay | Admitting: Internal Medicine

## 2013-06-27 ENCOUNTER — Ambulatory Visit: Payer: Medicare Other | Admitting: Radiation Oncology

## 2013-06-29 NOTE — Progress Notes (Signed)
GU Location of Tumor / Histology: prostate adenocarcinoma   If Prostate Cancer, Gleason Score is (3 + 4=7 and 3+3=6) and PSA is (14.71 on 12/23/12) and prostate volume is 77 cc   Patient presented with signs/symptoms of: an elevated PSA of 10.32.   Biopsies of prostate (if applicable) revealed:    Past/Anticipated interventions by urology, if any: prostate biopsy 02/07/13, having Firmagon therapy by Dr. Roni Bread   Past/Anticipated interventions by medical oncology, if any: none   Weight changes, if any: no   Bowel/Bladder complaints, if any: weak urinary stream occasionally, gets up 1-2 times per night to urinate   Nausea/Vomiting, if any: no   Pain issues, if any: has pain in his right knee from an old surgery  SAFETY ISSUES:  Prior radiation? No  Pacemaker/ICD? no  Possible current pregnancy? no  Is the patient on methotrexate? No  Current Complaints / other details: Married, two children.  IPSS score today is 9.

## 2013-07-04 ENCOUNTER — Ambulatory Visit
Admission: RE | Admit: 2013-07-04 | Discharge: 2013-07-04 | Disposition: A | Discharge status: Home / Self Care | Payer: Medicare Other | Source: Ambulatory Visit | Attending: Radiation Oncology | Admitting: Radiation Oncology

## 2013-07-04 ENCOUNTER — Encounter: Payer: Self-pay | Admitting: Radiation Oncology

## 2013-07-04 VITALS — BP 130/88 | HR 79 | Temp 98.1°F | Resp 16 | Ht 70.0 in | Wt 207.4 lb

## 2013-07-04 DIAGNOSIS — C61 Malignant neoplasm of prostate: Secondary | ICD-10-CM | POA: Insufficient documentation

## 2013-07-04 DIAGNOSIS — R3915 Urgency of urination: Secondary | ICD-10-CM | POA: Insufficient documentation

## 2013-07-04 DIAGNOSIS — R5383 Other fatigue: Secondary | ICD-10-CM

## 2013-07-04 DIAGNOSIS — R197 Diarrhea, unspecified: Secondary | ICD-10-CM | POA: Insufficient documentation

## 2013-07-04 DIAGNOSIS — R5381 Other malaise: Secondary | ICD-10-CM | POA: Insufficient documentation

## 2013-07-04 DIAGNOSIS — Z510 Encounter for antineoplastic radiation therapy: Secondary | ICD-10-CM | POA: Insufficient documentation

## 2013-07-04 NOTE — Progress Notes (Signed)
  Radiation Oncology         [phone] ________________________________  Name: [name] MRN: [id number]  Date: 07/04/2013  DOB: Dec 22, 1940  Reevaluation Note  CC: Wende Neighbors, MD Malka So, MD  Diagnosis:  Stage TIc Gleason's 7 adenocarcinoma of the prostate, PSA 14.71 ng/ml  TNM stage: T2a, Nx, M0    Narrative:  The patient returns today for further evaluation at the courtesy of Dr. Irine Seal.  Patient decided not to move to the [place] part of [place] and will remain in this area for his definitive treatment of his prostate cancer. He has sold his house in his currently living in loft in downtown [facility].  He has recently was given [place] and other than some hot flashes he tolerated this reasonably well. Patient in addition recently received a six-month the [facility] agonist.   most recent PSA is dropped from over 14 down to 3.83 and the patient's testosterone is at castrate level. Patient denies any new urination symptoms. He has nocturia  0-1. he denies any new bony pain.                            ALLERGIES:  has No Known Allergies.  Meds: Current Outpatient Prescriptions  Medication Sig Dispense Refill  . amLODipine (NORVASC) 5 MG tablet Take 5 mg by mouth daily.        Marland Kitchen atorvastatin (LIPITOR) 10 MG tablet Take 10 mg by mouth daily.        Marland Kitchen losartan-hydrochlorothiazide (HYZAAR) 100-25 MG per tablet Take 1 tablet by mouth daily.         No current facility-administered medications for this encounter.    Physical Findings: The patient is in no acute distress. Patient is alert and oriented.  height is 5\' 10"  (1.778 m) and weight is 207 lb 6.4 oz (94.076 kg). His oral temperature is 98.1 F (36.7 C). His blood pressure is 130/88 and his pulse is 79. His respiration is 16. Marland Kitchen  No palpable supraclavicular or or axillary adenopathy. Lungs are clear to auscultation. The heart has regular rhythm and rate. The abdomen is soft and nontender with normal bowel  sounds.  Lab Findings:  Most recent PSA 3.83   Impression:  Stage TIc Gleason's 7 adenocarcinoma of the prostate, PSA 14.71 ng/ml , TNM stage: T2a, Nx, M0.  Patient would be a good candidate for curative treatment of his prostate cancer with a combination of radiation therapy and androgen ablation. I discussed the radiation therapy options with the patient and at this time he would like to proceed with IMRT other than combination external beam and seed implantation. I discussed the treatment course side effects and potential toxicities of radiation therapy in this situation with the patient. He appears to understand and wishes to proceed with planned course of treatment.  Plan:  Simulation and planning after a fiducial markers are placed within the prostate gland by Dr. Jeffie Pollock. Anticipate a dose of 78 gray directed at the prostate gland.  ____________________________________ Blair Promise, MD

## 2013-07-04 NOTE — Progress Notes (Signed)
Please see the Nurse Progress Note in the MD Initial Consult Encounter for this patient. 

## 2013-07-05 ENCOUNTER — Telehealth: Payer: Self-pay | Admitting: *Deleted

## 2013-07-05 ENCOUNTER — Encounter: Payer: Self-pay | Admitting: *Deleted

## 2013-07-05 ENCOUNTER — Encounter: Payer: Self-pay | Admitting: Radiation Oncology

## 2013-07-05 NOTE — Telephone Encounter (Signed)
CALLED PATIENT TO INFORM OF GOLD SEED PLACEMENT ON 09-06-13- ARRIVAL TIME - 2:30 PM @ DR. WRENN'S OFFICE, INFORMED PATIENT THAT I WOULD TRY TO GET AN EARLIER DATE AND TIME FOR THE GOLD SEED PLACEMENT AND AS SOON AS I GET THAT ACCOMPLISHED I WILL SET UP HIS SIM, PATIENT VERIFIED UNDER STANDING THIS.

## 2013-08-07 ENCOUNTER — Encounter: Payer: Self-pay | Admitting: Radiation Oncology

## 2013-08-07 ENCOUNTER — Telehealth: Payer: Self-pay | Admitting: *Deleted

## 2013-08-07 NOTE — Telephone Encounter (Signed)
CALLED PATIENT TO INFORM THAT SIM DATE HAS BEEN MOVED TO 08-28-13 @ 11 AM WITH DR. KINARD, LVM FOR A RETURN CALL

## 2013-08-07 NOTE — Telephone Encounter (Signed)
CALLED PATIENT TO INFORM OF SIM APPT. FOR 08-14-13 @ 11 AM,  LVM FOR A RETURN CALL

## 2013-08-14 ENCOUNTER — Ambulatory Visit: Payer: Medicare Other | Admitting: Radiation Oncology

## 2013-08-28 ENCOUNTER — Encounter: Payer: Self-pay | Admitting: Radiation Oncology

## 2013-08-28 ENCOUNTER — Ambulatory Visit
Admission: RE | Admit: 2013-08-28 | Discharge: 2013-08-28 | Disposition: A | Discharge status: Home / Self Care | Payer: Medicare Other | Source: Ambulatory Visit | Attending: Radiation Oncology | Admitting: Radiation Oncology

## 2013-08-28 DIAGNOSIS — C61 Malignant neoplasm of prostate: Secondary | ICD-10-CM | POA: Diagnosis not present

## 2013-08-28 DIAGNOSIS — R197 Diarrhea, unspecified: Secondary | ICD-10-CM | POA: Diagnosis not present

## 2013-08-28 DIAGNOSIS — R5383 Other fatigue: Secondary | ICD-10-CM | POA: Diagnosis not present

## 2013-08-28 DIAGNOSIS — R5381 Other malaise: Secondary | ICD-10-CM | POA: Diagnosis not present

## 2013-08-28 DIAGNOSIS — Z510 Encounter for antineoplastic radiation therapy: Secondary | ICD-10-CM | POA: Diagnosis present

## 2013-08-28 DIAGNOSIS — R3915 Urgency of urination: Secondary | ICD-10-CM | POA: Diagnosis not present

## 2013-08-29 NOTE — Telephone Encounter (Signed)
Called patient and verified appointment times for radiation treatment through 11/01/13.  He verbalized appreciation for the call.

## 2013-08-29 NOTE — Progress Notes (Signed)
  Radiation Oncology         [phone] ________________________________  Name: [name] MRN: [id number]  Date: 08/28/2013  DOB: 1940-12-21  SIMULATION AND TREATMENT PLANNING NOTE  DIAGNOSIS: Stage TIc Gleason's 7 adenocarcinoma of the prostate, PSA 14.71 ng/ml  TNM stage: T2a, Nx, M0   NARRATIVE:  The patient was brought to the [facility].  Identity was confirmed.  All relevant records and images related to the planned course of therapy were reviewed.  The patient freely provided informed written consent to proceed with treatment after reviewing the details related to the planned course of therapy. The consent form was witnessed and verified by the simulation staff.  Then, the patient was set-up in a stable reproducible  supine position for radiation therapy.  CT images were obtained.  Surface markings were placed.  The CT images were loaded into the planning software.  Then the target and avoidance structures were contoured.  Treatment planning then occurred.  The radiation prescription was entered and confirmed.  Then, I designed and supervised the construction of a total of 1 medically necessary complex treatment devices.  I have requested : Intensity Modulated Radiotherapy (IMRT) is medically necessary for this case for the following reason:  Rectal sparing..  I have ordered:dose calc.  PLAN:  The patient will receive 78 Gy in 40 fractions.  ________________________________  Blair Promise,  M.D.

## 2013-08-30 DIAGNOSIS — Z510 Encounter for antineoplastic radiation therapy: Secondary | ICD-10-CM | POA: Diagnosis not present

## 2013-09-03 DIAGNOSIS — Z510 Encounter for antineoplastic radiation therapy: Secondary | ICD-10-CM | POA: Diagnosis not present

## 2013-09-06 ENCOUNTER — Ambulatory Visit
Admission: RE | Admit: 2013-09-06 | Discharge: 2013-09-06 | Disposition: A | Discharge status: Home / Self Care | Payer: Medicare Other | Source: Ambulatory Visit | Attending: Radiation Oncology | Admitting: Radiation Oncology

## 2013-09-06 DIAGNOSIS — C61 Malignant neoplasm of prostate: Secondary | ICD-10-CM

## 2013-09-06 DIAGNOSIS — Z510 Encounter for antineoplastic radiation therapy: Secondary | ICD-10-CM | POA: Diagnosis not present

## 2013-09-07 ENCOUNTER — Ambulatory Visit
Admission: RE | Admit: 2013-09-07 | Discharge: 2013-09-07 | Disposition: A | Discharge status: Home / Self Care | Payer: Medicare Other | Source: Ambulatory Visit | Attending: Radiation Oncology | Admitting: Radiation Oncology

## 2013-09-07 DIAGNOSIS — Z510 Encounter for antineoplastic radiation therapy: Secondary | ICD-10-CM | POA: Diagnosis not present

## 2013-09-10 ENCOUNTER — Ambulatory Visit
Admission: RE | Admit: 2013-09-10 | Discharge: 2013-09-10 | Disposition: A | Discharge status: Home / Self Care | Payer: Medicare Other | Source: Ambulatory Visit | Attending: Radiation Oncology | Admitting: Radiation Oncology

## 2013-09-10 DIAGNOSIS — Z510 Encounter for antineoplastic radiation therapy: Secondary | ICD-10-CM | POA: Diagnosis not present

## 2013-09-10 DIAGNOSIS — C61 Malignant neoplasm of prostate: Secondary | ICD-10-CM

## 2013-09-10 NOTE — Progress Notes (Signed)
[name] was given the Radiation Therapy and You and discussed potential side effects/management of diarrhea, skin changes, fatigue and bladder changes.  He was educated about under treat day with Dr. Sondra Come on Tuesdays.  He stated that he has an important meeting at work tomorrow and will not be able to stay.  Advised him that Dr. Sondra Come may be able to see him on Wednesday this week.  He was advised to call with any questions or concerns.

## 2013-09-11 ENCOUNTER — Ambulatory Visit: Payer: Medicare Other | Admitting: Radiation Oncology

## 2013-09-11 ENCOUNTER — Ambulatory Visit
Admission: RE | Admit: 2013-09-11 | Discharge: 2013-09-11 | Disposition: A | Discharge status: Home / Self Care | Payer: Medicare Other | Source: Ambulatory Visit | Attending: Radiation Oncology | Admitting: Radiation Oncology

## 2013-09-11 DIAGNOSIS — Z510 Encounter for antineoplastic radiation therapy: Secondary | ICD-10-CM | POA: Diagnosis not present

## 2013-09-12 ENCOUNTER — Encounter: Payer: Self-pay | Admitting: Radiation Oncology

## 2013-09-12 ENCOUNTER — Ambulatory Visit: Payer: Medicare Other | Admitting: Radiation Oncology

## 2013-09-12 ENCOUNTER — Ambulatory Visit
Admission: RE | Admit: 2013-09-12 | Discharge: 2013-09-12 | Disposition: A | Discharge status: Home / Self Care | Payer: Medicare Other | Source: Ambulatory Visit | Attending: Radiation Oncology | Admitting: Radiation Oncology

## 2013-09-12 VITALS — BP 130/78 | HR 76 | Temp 98.2°F | Resp 20 | Wt 209.5 lb

## 2013-09-12 DIAGNOSIS — Z510 Encounter for antineoplastic radiation therapy: Secondary | ICD-10-CM | POA: Diagnosis not present

## 2013-09-12 DIAGNOSIS — C61 Malignant neoplasm of prostate: Secondary | ICD-10-CM

## 2013-09-12 NOTE — Progress Notes (Signed)
wekly rad txs 5 completed prosatte, no c/o pain, dysuria, stram is good, no diarrhea, or hematuria, regular bm's, appetite good, only up x 1 at night to void  3:13 PM

## 2013-09-12 NOTE — Progress Notes (Signed)
  Radiation Oncology         [phone] ________________________________  Name: [name] MRN: [id number]  Date: 09/12/2013  DOB: 05-06-1940  Weekly Radiation Therapy Management  DIAGNOSIS: Stage TIc Gleason's 7 adenocarcinoma of the prostate, PSA 14.71 ng/ml  TNM stage: T2a, Nx, M0   Current Dose: 9.75 Gy     Planned Dose:  78 Gy  Narrative . . . . . . . . The patient presents for routine under treatment assessment.                                   The patient is without complaint.                                 Set-up films were reviewed.                                 The chart was checked. Physical Findings. . .  weight is 209 lb 8 oz (95.029 kg). His oral temperature is 98.2 F (36.8 C). His blood pressure is 130/78 and his pulse is 76. His respiration is 20. . Weight essentially stable.  The lungs are clear. The heart has a regular rhythm and rate. The abdomen is soft and nontender with normal bowel sounds. Impression . . . . . . . The patient is tolerating radiation. Plan . . . . . . . . . . . . Continue treatment as planned.  ________________________________   Blair Promise, PhD, MD

## 2013-09-13 ENCOUNTER — Ambulatory Visit
Admission: RE | Admit: 2013-09-13 | Discharge: 2013-09-13 | Disposition: A | Discharge status: Home / Self Care | Payer: Medicare Other | Source: Ambulatory Visit | Attending: Radiation Oncology | Admitting: Radiation Oncology

## 2013-09-13 DIAGNOSIS — Z510 Encounter for antineoplastic radiation therapy: Secondary | ICD-10-CM | POA: Diagnosis not present

## 2013-09-14 ENCOUNTER — Ambulatory Visit
Admission: RE | Admit: 2013-09-14 | Discharge: 2013-09-14 | Disposition: A | Discharge status: Home / Self Care | Payer: Medicare Other | Source: Ambulatory Visit | Attending: Radiation Oncology | Admitting: Radiation Oncology

## 2013-09-14 DIAGNOSIS — Z510 Encounter for antineoplastic radiation therapy: Secondary | ICD-10-CM | POA: Diagnosis not present

## 2013-09-17 ENCOUNTER — Ambulatory Visit
Admission: RE | Admit: 2013-09-17 | Discharge: 2013-09-17 | Disposition: A | Discharge status: Home / Self Care | Payer: Medicare Other | Source: Ambulatory Visit | Attending: Radiation Oncology | Admitting: Radiation Oncology

## 2013-09-17 DIAGNOSIS — Z510 Encounter for antineoplastic radiation therapy: Secondary | ICD-10-CM | POA: Diagnosis not present

## 2013-09-18 ENCOUNTER — Ambulatory Visit: Payer: Medicare Other | Admitting: Radiation Oncology

## 2013-09-18 ENCOUNTER — Ambulatory Visit: Payer: Medicare Other

## 2013-09-19 ENCOUNTER — Ambulatory Visit
Admission: RE | Admit: 2013-09-19 | Discharge: 2013-09-19 | Disposition: A | Discharge status: Home / Self Care | Payer: Medicare Other | Source: Ambulatory Visit | Attending: Radiation Oncology | Admitting: Radiation Oncology

## 2013-09-19 DIAGNOSIS — Z510 Encounter for antineoplastic radiation therapy: Secondary | ICD-10-CM | POA: Diagnosis not present

## 2013-09-20 ENCOUNTER — Ambulatory Visit
Admission: RE | Admit: 2013-09-20 | Discharge: 2013-09-20 | Disposition: A | Discharge status: Home / Self Care | Payer: Medicare Other | Source: Ambulatory Visit | Attending: Radiation Oncology | Admitting: Radiation Oncology

## 2013-09-20 ENCOUNTER — Encounter: Payer: Self-pay | Admitting: Radiation Oncology

## 2013-09-20 VITALS — BP 137/81 | HR 76 | Temp 98.2°F | Ht 70.0 in | Wt 208.6 lb

## 2013-09-20 DIAGNOSIS — C61 Malignant neoplasm of prostate: Secondary | ICD-10-CM

## 2013-09-20 DIAGNOSIS — Z510 Encounter for antineoplastic radiation therapy: Secondary | ICD-10-CM | POA: Diagnosis not present

## 2013-09-20 NOTE — Progress Notes (Signed)
[name] has completed 10/40 fractions to his prostate.  He denies pain, dysuria, hematuria, skin irritation and diarrhea.  He reports getting up once per night to urinate.  He reports slight fatigue.

## 2013-09-20 NOTE — Progress Notes (Signed)
  Radiation Oncology         [phone] ________________________________  Name: [name] MRN: [id number]  Date: 09/20/2013  DOB: Mar 23, 1940  Weekly Radiation Therapy Management  DIAGNOSIS: Stage TIc Gleason's 7 adenocarcinoma of the prostate, PSA 14.71 ng/ml  TNM stage: T2a, Nx, M0   Current Dose: 19.5 Gy     Planned Dose:  78 Gy  Narrative . . . . . . . . The patient presents for routine under treatment assessment.                                   The patient is without complaint except for some mild fatigue. He denies any urinary symptoms or bowel complaints. He did have one episode of mild diarrhea.                                 Set-up films were reviewed.                                 The chart was checked. Physical Findings. . .  height is 5\' 10"  (1.778 m) and weight is 208 lb 9.6 oz (94.62 kg). His oral temperature is 98.2 F (36.8 C). His blood pressure is 137/81 and his pulse is 76. . The lungs are clear. The heart has a regular rhythm and rate. The abdomen is soft and nontender normal bowel sounds. Impression . . . . . . . The patient is tolerating radiation. Plan . . . . . . . . . . . . Continue treatment as planned.  ________________________________   Blair Promise, PhD, MD

## 2013-09-21 ENCOUNTER — Ambulatory Visit
Admission: RE | Admit: 2013-09-21 | Discharge: 2013-09-21 | Disposition: A | Discharge status: Home / Self Care | Payer: Medicare Other | Source: Ambulatory Visit | Attending: Radiation Oncology | Admitting: Radiation Oncology

## 2013-09-21 DIAGNOSIS — Z510 Encounter for antineoplastic radiation therapy: Secondary | ICD-10-CM | POA: Diagnosis not present

## 2013-09-25 ENCOUNTER — Ambulatory Visit
Admission: RE | Admit: 2013-09-25 | Discharge: 2013-09-25 | Disposition: A | Discharge status: Home / Self Care | Payer: Medicare Other | Source: Ambulatory Visit | Attending: Radiation Oncology | Admitting: Radiation Oncology

## 2013-09-25 DIAGNOSIS — Z510 Encounter for antineoplastic radiation therapy: Secondary | ICD-10-CM | POA: Diagnosis not present

## 2013-09-26 ENCOUNTER — Ambulatory Visit
Admission: RE | Admit: 2013-09-26 | Discharge: 2013-09-26 | Disposition: A | Discharge status: Home / Self Care | Payer: Medicare Other | Source: Ambulatory Visit | Attending: Radiation Oncology | Admitting: Radiation Oncology

## 2013-09-26 ENCOUNTER — Encounter: Payer: Self-pay | Admitting: Radiation Oncology

## 2013-09-26 VITALS — BP 132/79 | HR 77 | Temp 98.3°F | Ht 70.0 in | Wt 210.1 lb

## 2013-09-26 DIAGNOSIS — C61 Malignant neoplasm of prostate: Secondary | ICD-10-CM

## 2013-09-26 DIAGNOSIS — Z510 Encounter for antineoplastic radiation therapy: Secondary | ICD-10-CM | POA: Diagnosis not present

## 2013-09-26 NOTE — Progress Notes (Signed)
  Radiation Oncology         [phone] ________________________________  Name: [name] MRN: [id number]  Date: 09/26/2013  DOB: 03/07/1940  Weekly Radiation Therapy Management    Current Dose: 25.35 Gy     Planned Dose:  78 Gy  Narrative . . . . . . . . The patient presents for routine under treatment assessment.                                   The patient is without complaint. He has noticed slightly more urinary urgency but no other symptoms                                 Set-up films were reviewed.                                 The chart was checked. Physical Findings. . .  height is 5\' 10"  (1.778 m) and weight is 210 lb 1.6 oz (95.301 kg). His oral temperature is 98.3 F (36.8 C). His blood pressure is 132/79 and his pulse is 77. . Weight essentially stable.  No significant changes. Impression . . . . . . . The patient is tolerating radiation. Plan . . . . . . . . . . . . Continue treatment as planned.  ________________________________   Blair Promise, PhD, MD

## 2013-09-26 NOTE — Progress Notes (Signed)
[name] has completed 13 fractions to his prostate.  He denies pain, urinary frequency, dysuria, hematuria, .  He reports feeling more urgency when having to urinate.  He reports getting up once per night to urinate.  He had one episode of diarrhea this week.  He reports fatigue.

## 2013-09-27 ENCOUNTER — Ambulatory Visit
Admission: RE | Admit: 2013-09-27 | Discharge: 2013-09-27 | Disposition: A | Discharge status: Home / Self Care | Payer: Medicare Other | Source: Ambulatory Visit | Attending: Radiation Oncology | Admitting: Radiation Oncology

## 2013-09-27 DIAGNOSIS — Z510 Encounter for antineoplastic radiation therapy: Secondary | ICD-10-CM | POA: Diagnosis not present

## 2013-09-28 ENCOUNTER — Ambulatory Visit
Admission: RE | Admit: 2013-09-28 | Discharge: 2013-09-28 | Disposition: A | Discharge status: Home / Self Care | Payer: Medicare Other | Source: Ambulatory Visit | Attending: Radiation Oncology | Admitting: Radiation Oncology

## 2013-09-28 DIAGNOSIS — Z510 Encounter for antineoplastic radiation therapy: Secondary | ICD-10-CM | POA: Diagnosis not present

## 2013-10-01 ENCOUNTER — Encounter: Payer: Self-pay | Admitting: Radiation Oncology

## 2013-10-01 ENCOUNTER — Ambulatory Visit
Admission: RE | Admit: 2013-10-01 | Discharge: 2013-10-01 | Disposition: A | Discharge status: Home / Self Care | Payer: Medicare Other | Source: Ambulatory Visit | Attending: Radiation Oncology | Admitting: Radiation Oncology

## 2013-10-01 DIAGNOSIS — Z510 Encounter for antineoplastic radiation therapy: Secondary | ICD-10-CM | POA: Diagnosis not present

## 2013-10-02 ENCOUNTER — Ambulatory Visit
Admission: RE | Admit: 2013-10-02 | Discharge: 2013-10-02 | Disposition: A | Discharge status: Home / Self Care | Payer: Medicare Other | Source: Ambulatory Visit | Attending: Radiation Oncology | Admitting: Radiation Oncology

## 2013-10-02 ENCOUNTER — Encounter: Payer: Self-pay | Admitting: Radiation Oncology

## 2013-10-02 VITALS — BP 128/74 | HR 81 | Temp 98.1°F | Resp 16 | Ht 70.0 in | Wt 207.8 lb

## 2013-10-02 DIAGNOSIS — Z510 Encounter for antineoplastic radiation therapy: Secondary | ICD-10-CM | POA: Diagnosis present

## 2013-10-02 DIAGNOSIS — R197 Diarrhea, unspecified: Secondary | ICD-10-CM | POA: Insufficient documentation

## 2013-10-02 DIAGNOSIS — R5381 Other malaise: Secondary | ICD-10-CM | POA: Insufficient documentation

## 2013-10-02 DIAGNOSIS — C61 Malignant neoplasm of prostate: Secondary | ICD-10-CM | POA: Diagnosis not present

## 2013-10-02 DIAGNOSIS — R3915 Urgency of urination: Secondary | ICD-10-CM | POA: Insufficient documentation

## 2013-10-02 DIAGNOSIS — R5383 Other fatigue: Secondary | ICD-10-CM

## 2013-10-02 NOTE — Progress Notes (Signed)
  Radiation Oncology         [phone] ________________________________  Name: [name] MRN: [id number]  Date: 10/02/2013  DOB: 01-25-1940  Weekly Radiation Therapy Management  Current Dose: 33.15 Gy     Planned Dose:  78 Gy  Narrative . . . . . . . . The patient presents for routine under treatment assessment.                                   The patient is without complaint.  He has noticed some slowing of his urinary stream  And does  strain on occasion but does feel he empties his bladder well later during the course of the day.                                 Set-up films were reviewed.                                 The chart was checked. Physical Findings. . .  height is 5\' 10"  (1.778 m) and weight is 207 lb 12.8 oz (94.257 kg). His oral temperature is 98.1 F (36.7 C). His blood pressure is 128/74 and his pulse is 81. His respiration is 16. . The lungs are clear. The heart has a regular rhythm and rate. Impression . . . . . . . The patient is tolerating radiation. Plan . . . . . . . . . . . . Continue treatment as planned.  ________________________________   Blair Promise, PhD, MD

## 2013-10-02 NOTE — Progress Notes (Signed)
[name] has completed 17/40 fractions to his prostate.  He denies pain, dysuria, hematuria and diarrhea.  He reports an increase in urinary frequency with having to strain to keep his stream going.  He reports getting up once per night to urinate.  He reports fatigue.  He sent an email yesterday stating that he will have to miss treatment due to work on 9/1, 9/18, 10/6 and 10/7.

## 2013-10-03 ENCOUNTER — Ambulatory Visit
Admission: RE | Admit: 2013-10-03 | Discharge: 2013-10-03 | Disposition: A | Discharge status: Home / Self Care | Payer: Medicare Other | Source: Ambulatory Visit | Attending: Radiation Oncology | Admitting: Radiation Oncology

## 2013-10-03 DIAGNOSIS — Z510 Encounter for antineoplastic radiation therapy: Secondary | ICD-10-CM | POA: Diagnosis not present

## 2013-10-04 ENCOUNTER — Ambulatory Visit: Payer: Medicare Other

## 2013-10-05 ENCOUNTER — Ambulatory Visit
Admission: RE | Admit: 2013-10-05 | Discharge: 2013-10-05 | Disposition: A | Discharge status: Home / Self Care | Payer: Medicare Other | Source: Ambulatory Visit | Attending: Radiation Oncology | Admitting: Radiation Oncology

## 2013-10-05 DIAGNOSIS — Z510 Encounter for antineoplastic radiation therapy: Secondary | ICD-10-CM | POA: Diagnosis not present

## 2013-10-08 ENCOUNTER — Ambulatory Visit
Admission: RE | Admit: 2013-10-08 | Discharge: 2013-10-08 | Disposition: A | Discharge status: Home / Self Care | Payer: Medicare Other | Source: Ambulatory Visit | Attending: Radiation Oncology | Admitting: Radiation Oncology

## 2013-10-08 DIAGNOSIS — Z510 Encounter for antineoplastic radiation therapy: Secondary | ICD-10-CM | POA: Diagnosis not present

## 2013-10-09 ENCOUNTER — Encounter: Payer: Self-pay | Admitting: Radiation Oncology

## 2013-10-09 ENCOUNTER — Ambulatory Visit
Admission: RE | Admit: 2013-10-09 | Discharge: 2013-10-09 | Disposition: A | Discharge status: Home / Self Care | Payer: Medicare Other | Source: Ambulatory Visit | Attending: Radiation Oncology | Admitting: Radiation Oncology

## 2013-10-09 VITALS — BP 140/76 | HR 75 | Temp 98.2°F | Resp 16 | Ht 70.0 in | Wt 210.1 lb

## 2013-10-09 DIAGNOSIS — Z510 Encounter for antineoplastic radiation therapy: Secondary | ICD-10-CM | POA: Diagnosis not present

## 2013-10-09 DIAGNOSIS — C61 Malignant neoplasm of prostate: Secondary | ICD-10-CM

## 2013-10-09 NOTE — Progress Notes (Signed)
[name] has completed 21 fractions to his prostate.  He denies pain.  He continues to notice that his urinary stream is not as strong.  He reports getting up 1-2 times per night to urinate.  He denies dysuria, hematuria, skin irritation and diarrhea.  He reports his level of fatigue is the same.

## 2013-10-09 NOTE — Progress Notes (Signed)
  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 . . . . . . . The patient presents for routine under treatment assessment.                                   The patient is without complaint except for mild slowing of his urinary stream. He does feel he can empty his bladder. He denies any dysuria or hematuria. He denies any bowel complaints                                 Set-up films were reviewed.                                 The chart was checked. Physical Findings. . .  height is 5\' 10"  (1.778 m) and weight is 210 lb 1.6 oz (95.301 kg). His oral temperature is 98.2 F (36.8 C). His blood pressure is 140/76 and his pulse is 75. His respiration is 16. . The lungs are clear. The heart has regular rhythm and rate. The abdomen is soft and nontender with normal bowel sounds. Impression . . . . . . . The patient is tolerating radiation. Plan . . . . . . . . . . . . Continue treatment as planned.  ________________________________   Blair Promise, PhD, MD

## 2013-10-10 ENCOUNTER — Ambulatory Visit
Admission: RE | Admit: 2013-10-10 | Discharge: 2013-10-10 | Disposition: A | Discharge status: Home / Self Care | Payer: Medicare Other | Source: Ambulatory Visit | Attending: Radiation Oncology | Admitting: Radiation Oncology

## 2013-10-10 DIAGNOSIS — Z510 Encounter for antineoplastic radiation therapy: Secondary | ICD-10-CM | POA: Diagnosis not present

## 2013-10-11 ENCOUNTER — Ambulatory Visit
Admission: RE | Admit: 2013-10-11 | Discharge: 2013-10-11 | Disposition: A | Discharge status: Home / Self Care | Payer: Medicare Other | Source: Ambulatory Visit | Attending: Radiation Oncology | Admitting: Radiation Oncology

## 2013-10-11 DIAGNOSIS — Z510 Encounter for antineoplastic radiation therapy: Secondary | ICD-10-CM | POA: Diagnosis not present

## 2013-10-12 ENCOUNTER — Ambulatory Visit
Admission: RE | Admit: 2013-10-12 | Discharge: 2013-10-12 | Disposition: A | Discharge status: Home / Self Care | Payer: Medicare Other | Source: Ambulatory Visit | Attending: Radiation Oncology | Admitting: Radiation Oncology

## 2013-10-12 DIAGNOSIS — Z510 Encounter for antineoplastic radiation therapy: Secondary | ICD-10-CM | POA: Diagnosis not present

## 2013-10-15 ENCOUNTER — Ambulatory Visit
Admission: RE | Admit: 2013-10-15 | Discharge: 2013-10-15 | Disposition: A | Discharge status: Home / Self Care | Payer: Medicare Other | Source: Ambulatory Visit | Attending: Radiation Oncology | Admitting: Radiation Oncology

## 2013-10-15 DIAGNOSIS — Z510 Encounter for antineoplastic radiation therapy: Secondary | ICD-10-CM | POA: Diagnosis not present

## 2013-10-16 ENCOUNTER — Ambulatory Visit
Admission: RE | Admit: 2013-10-16 | Discharge: 2013-10-16 | Disposition: A | Discharge status: Home / Self Care | Payer: Medicare Other | Source: Ambulatory Visit | Attending: Radiation Oncology | Admitting: Radiation Oncology

## 2013-10-16 VITALS — BP 108/75 | HR 78 | Temp 98.1°F | Resp 12 | Ht 70.0 in | Wt 208.5 lb

## 2013-10-16 DIAGNOSIS — C61 Malignant neoplasm of prostate: Secondary | ICD-10-CM

## 2013-10-16 DIAGNOSIS — Z510 Encounter for antineoplastic radiation therapy: Secondary | ICD-10-CM | POA: Diagnosis not present

## 2013-10-16 NOTE — Progress Notes (Signed)
  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 . . . . . . . The patient presents for routine under treatment assessment.                                   The patient is without complaint. 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 He is forcing fluids to help with this issue. He denies any dizziness. The patient's imaging this morning his bladder filling was lower than normal and he will work on improving this issue.                                 Set-up films were reviewed.                                 The chart was checked. Physical Findings. . . Weight essentially stable. The lungs are clear. The heart has a regular rhythm and rate. The abdomen is soft and nontender with normal bowel sounds. Impression . . . . . . . The patient is tolerating radiation. Plan . . . . . . . . . . . . Continue treatment as planned.  ________________________________   Blair Promise, PhD, MD

## 2013-10-16 NOTE — Progress Notes (Signed)
Xaiver reports nocturia x 2. He denies dysuria, urinary frequency and diarrhea.  He reports that his bp is low today due to not drinking very much yesterday because he was working in the field.

## 2013-10-17 ENCOUNTER — Ambulatory Visit
Admission: RE | Admit: 2013-10-17 | Discharge: 2013-10-17 | Disposition: A | Discharge status: Home / Self Care | Payer: Medicare Other | Source: Ambulatory Visit | Attending: Radiation Oncology | Admitting: Radiation Oncology

## 2013-10-17 DIAGNOSIS — Z510 Encounter for antineoplastic radiation therapy: Secondary | ICD-10-CM | POA: Diagnosis not present

## 2013-10-18 ENCOUNTER — Ambulatory Visit
Admission: RE | Admit: 2013-10-18 | Discharge: 2013-10-18 | Disposition: A | Discharge status: Home / Self Care | Payer: Medicare Other | Source: Ambulatory Visit | Attending: Radiation Oncology | Admitting: Radiation Oncology

## 2013-10-18 DIAGNOSIS — Z510 Encounter for antineoplastic radiation therapy: Secondary | ICD-10-CM | POA: Diagnosis present

## 2013-10-18 DIAGNOSIS — C61 Malignant neoplasm of prostate: Secondary | ICD-10-CM | POA: Insufficient documentation

## 2013-10-19 ENCOUNTER — Ambulatory Visit
Admission: RE | Admit: 2013-10-19 | Discharge: 2013-10-19 | Disposition: A | Discharge status: Home / Self Care | Payer: Medicare Other | Source: Ambulatory Visit | Attending: Radiation Oncology | Admitting: Radiation Oncology

## 2013-10-19 DIAGNOSIS — Z510 Encounter for antineoplastic radiation therapy: Secondary | ICD-10-CM | POA: Diagnosis not present

## 2013-10-22 ENCOUNTER — Ambulatory Visit
Admission: RE | Admit: 2013-10-22 | Discharge: 2013-10-22 | Disposition: A | Discharge status: Home / Self Care | Payer: Medicare Other | Source: Ambulatory Visit | Attending: Radiation Oncology | Admitting: Radiation Oncology

## 2013-10-22 DIAGNOSIS — Z510 Encounter for antineoplastic radiation therapy: Secondary | ICD-10-CM | POA: Diagnosis not present

## 2013-10-23 ENCOUNTER — Ambulatory Visit: Payer: Medicare Other

## 2013-10-23 ENCOUNTER — Ambulatory Visit: Payer: Medicare Other | Admitting: Radiation Oncology

## 2013-10-24 ENCOUNTER — Inpatient Hospital Stay
Admission: RE | Admit: 2013-10-24 | Discharge: 2013-10-24 | Disposition: A | Discharge status: Home / Self Care | Payer: Self-pay | Source: Ambulatory Visit | Attending: Radiation Oncology | Admitting: Radiation Oncology

## 2013-10-24 ENCOUNTER — Encounter: Payer: Self-pay | Admitting: Radiation Oncology

## 2013-10-24 ENCOUNTER — Ambulatory Visit
Admission: RE | Admit: 2013-10-24 | Discharge: 2013-10-24 | Disposition: A | Discharge status: Home / Self Care | Payer: Medicare Other | Source: Ambulatory Visit | Attending: Radiation Oncology | Admitting: Radiation Oncology

## 2013-10-24 VITALS — BP 131/78 | HR 84 | Temp 98.2°F | Ht 70.0 in | Wt 212.2 lb

## 2013-10-24 DIAGNOSIS — C61 Malignant neoplasm of prostate: Secondary | ICD-10-CM

## 2013-10-24 DIAGNOSIS — Z510 Encounter for antineoplastic radiation therapy: Secondary | ICD-10-CM | POA: Diagnosis not present

## 2013-10-24 NOTE — Progress Notes (Signed)
  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 . . . . . . . The patient presents for routine under treatment assessment.                                   The patient is without complaint. 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 Patient has nocturia x2. He denies any significant dysuria or bowel complaints.                                 Set-up films were reviewed.                                 The chart was checked. Physical Findings. . .  height is 5\' 10"  (1.778 m) and weight is 212 lb 3.2 oz (96.253 kg). His temperature is 98.2 F (36.8 C). His blood pressure is 131/78 and his pulse is 84. . The lungs are clear. The heart has a regular rhythm and rate. The abdomen is soft nontender with normal bowel sounds. Impression . . . . . . . The patient is tolerating radiation. Plan . . . . . . . . . . . . Continue treatment as planned.  ________________________________   Blair Promise, PhD, MD

## 2013-10-24 NOTE — Progress Notes (Signed)
Mr. [name] has received 31 fractions to his pelvis for prostate cancer.  He reports Nocturia 2-3 times, but denies any diarrhea, rectal pain, nor dysuria at this time.

## 2013-10-25 ENCOUNTER — Ambulatory Visit
Admission: RE | Admit: 2013-10-25 | Discharge: 2013-10-25 | Disposition: A | Discharge status: Home / Self Care | Payer: Medicare Other | Source: Ambulatory Visit | Attending: Radiation Oncology | Admitting: Radiation Oncology

## 2013-10-25 DIAGNOSIS — Z510 Encounter for antineoplastic radiation therapy: Secondary | ICD-10-CM | POA: Diagnosis not present

## 2013-10-26 ENCOUNTER — Ambulatory Visit
Admission: RE | Admit: 2013-10-26 | Discharge: 2013-10-26 | Disposition: A | Discharge status: Home / Self Care | Payer: Medicare Other | Source: Ambulatory Visit | Attending: Radiation Oncology | Admitting: Radiation Oncology

## 2013-10-26 DIAGNOSIS — Z510 Encounter for antineoplastic radiation therapy: Secondary | ICD-10-CM | POA: Diagnosis not present

## 2013-10-29 ENCOUNTER — Ambulatory Visit
Admission: RE | Admit: 2013-10-29 | Discharge: 2013-10-29 | Disposition: A | Discharge status: Home / Self Care | Payer: Medicare Other | Source: Ambulatory Visit | Attending: Radiation Oncology | Admitting: Radiation Oncology

## 2013-10-29 DIAGNOSIS — Z510 Encounter for antineoplastic radiation therapy: Secondary | ICD-10-CM | POA: Diagnosis not present

## 2013-10-30 ENCOUNTER — Ambulatory Visit
Admission: RE | Admit: 2013-10-30 | Discharge: 2013-10-30 | Disposition: A | Discharge status: Home / Self Care | Payer: Medicare Other | Source: Ambulatory Visit | Attending: Radiation Oncology | Admitting: Radiation Oncology

## 2013-10-30 ENCOUNTER — Encounter: Payer: Self-pay | Admitting: Radiation Oncology

## 2013-10-30 VITALS — BP 141/76 | HR 78 | Temp 98.1°F | Resp 20 | Ht 70.0 in | Wt 214.1 lb

## 2013-10-30 DIAGNOSIS — Z510 Encounter for antineoplastic radiation therapy: Secondary | ICD-10-CM | POA: Diagnosis not present

## 2013-10-30 DIAGNOSIS — C61 Malignant neoplasm of prostate: Secondary | ICD-10-CM

## 2013-10-30 NOTE — Progress Notes (Signed)
  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 . . . . . . . The patient presents for routine under treatment assessment.                                   The patient is without complaint. He has noticed some mild swelling in the urinary stream but is able to empty his bladder. Patient also reports fatigue but no bowel issues.                                 Set-up films were reviewed.                                 The chart was checked. Physical Findings. . .  height is 5\' 10"  (1.778 m) and weight is 214 lb 1.6 oz (97.115 kg). His oral temperature is 98.1 F (36.7 C). His blood pressure is 141/76 and his pulse is 78. His respiration is 20. . The lungs are clear. The heart has a regular rhythm and rate. The abdomen is soft and nontender with normal bowel sounds. Impression . . . . . . . The patient is tolerating radiation. Plan . . . . . . . . . . . . Continue treatment as planned.  ________________________________   Blair Promise, PhD, MD

## 2013-10-30 NOTE — Progress Notes (Addendum)
[name] has completed 35 fractions to his prostate.  He denies an increase in urinary frequency, dysuria, hematuria and diarrhea.  He reports getting up 1-2 times per night to urinate.  He reports his urinary flow is less than before radiation. He reports that he is feeling tired.

## 2013-10-31 ENCOUNTER — Ambulatory Visit
Admission: RE | Admit: 2013-10-31 | Discharge: 2013-10-31 | Disposition: A | Discharge status: Home / Self Care | Payer: Medicare Other | Source: Ambulatory Visit | Attending: Radiation Oncology | Admitting: Radiation Oncology

## 2013-10-31 DIAGNOSIS — Z510 Encounter for antineoplastic radiation therapy: Secondary | ICD-10-CM | POA: Diagnosis not present

## 2013-11-01 ENCOUNTER — Ambulatory Visit
Admission: RE | Admit: 2013-11-01 | Discharge: 2013-11-01 | Disposition: A | Discharge status: Home / Self Care | Payer: Medicare Other | Source: Ambulatory Visit | Attending: Radiation Oncology | Admitting: Radiation Oncology

## 2013-11-01 ENCOUNTER — Ambulatory Visit: Payer: Medicare Other

## 2013-11-01 DIAGNOSIS — Z510 Encounter for antineoplastic radiation therapy: Secondary | ICD-10-CM | POA: Diagnosis not present

## 2013-11-02 ENCOUNTER — Ambulatory Visit: Payer: Medicare Other

## 2013-11-02 ENCOUNTER — Ambulatory Visit
Admission: RE | Admit: 2013-11-02 | Discharge: 2013-11-02 | Disposition: A | Discharge status: Home / Self Care | Payer: Medicare Other | Source: Ambulatory Visit | Attending: Radiation Oncology | Admitting: Radiation Oncology

## 2013-11-02 DIAGNOSIS — Z510 Encounter for antineoplastic radiation therapy: Secondary | ICD-10-CM | POA: Diagnosis not present

## 2013-11-05 ENCOUNTER — Ambulatory Visit
Admission: RE | Admit: 2013-11-05 | Discharge: 2013-11-05 | Disposition: A | Discharge status: Home / Self Care | Payer: Medicare Other | Source: Ambulatory Visit | Attending: Radiation Oncology | Admitting: Radiation Oncology

## 2013-11-05 ENCOUNTER — Ambulatory Visit: Payer: Medicare Other

## 2013-11-05 DIAGNOSIS — Z510 Encounter for antineoplastic radiation therapy: Secondary | ICD-10-CM | POA: Diagnosis not present

## 2013-11-06 ENCOUNTER — Ambulatory Visit
Admission: RE | Admit: 2013-11-06 | Discharge: 2013-11-06 | Disposition: A | Discharge status: Home / Self Care | Payer: Medicare Other | Source: Ambulatory Visit | Attending: Radiation Oncology | Admitting: Radiation Oncology

## 2013-11-06 ENCOUNTER — Ambulatory Visit: Payer: Medicare Other

## 2013-11-06 ENCOUNTER — Encounter: Payer: Self-pay | Admitting: Radiation Oncology

## 2013-11-06 VITALS — BP 130/75 | HR 85 | Temp 98.4°F | Resp 16 | Ht 70.0 in | Wt 212.8 lb

## 2013-11-06 DIAGNOSIS — Z510 Encounter for antineoplastic radiation therapy: Secondary | ICD-10-CM | POA: Diagnosis not present

## 2013-11-06 DIAGNOSIS — C61 Malignant neoplasm of prostate: Secondary | ICD-10-CM

## 2013-11-06 NOTE — Progress Notes (Signed)
  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 . . . . . . . The patient presents for routine under treatment assessment.                                   The patient is happy to complete his radiation therapy today. He continues to have minimal symptoms and work his usual schedule. He denies any hematuria or rectal bleeding.                                 Set-up films were reviewed.                                 The chart was checked. Physical Findings. . .  height is 5\' 10"  (1.778 m) and weight is 212 lb 12.8 oz (96.525 kg). His oral temperature is 98.4 F (36.9 C). His blood pressure is 130/75 and his pulse is 85. His respiration is 16. . Weight essentially stable.  No significant changes. Impression . . . . . . . The patient is tolerated radiation well. Plan . . . . . . . . . . . Marland Kitchen routine followup in one month.  ________________________________   Blair Promise, PhD, MD

## 2013-11-06 NOTE — Progress Notes (Signed)
[name] has completed 40 fractions to his prostate. He denies pain, dysuria, hematuria, urinary frequency and diarrhea.  He continues to have fatigue.  He reports his urinary stream is the same this week.  He reports getting up 1-2 times per night to urinate.  He has a follow up appointment for November.

## 2013-11-08 ENCOUNTER — Ambulatory Visit: Payer: Medicare Other

## 2013-11-18 ENCOUNTER — Encounter: Payer: Self-pay | Admitting: Radiation Oncology

## 2013-11-18 NOTE — Progress Notes (Signed)
  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   He had minimal fatigue during the course of his treatments. He denied any significant bladder or bowel symptoms.  Plan: The patient has completed radiation treatment. The patient will return to radiation oncology clinic for routine followup in one month. I advised them to call or return sooner if they have any questions or concerns related to their recovery or treatment.  -----------------------------------  Blair Promise, PhD, MD

## 2013-11-19 ENCOUNTER — Encounter: Payer: Self-pay | Admitting: Radiation Oncology

## 2013-11-20 ENCOUNTER — Telehealth: Payer: Self-pay | Admitting: Oncology

## 2013-11-20 NOTE — Telephone Encounter (Signed)
Left a message for [name] asking if he would be able to come in to sign a release of information form to send his records to his primary care doctor.  Requested a call back.

## 2013-12-05 ENCOUNTER — Encounter: Payer: Self-pay | Admitting: Oncology

## 2013-12-06 ENCOUNTER — Ambulatory Visit
Admission: RE | Admit: 2013-12-06 | Discharge: 2013-12-06 | Disposition: A | Discharge status: Home / Self Care | Payer: Medicare Other | Source: Ambulatory Visit | Attending: Radiation Oncology | Admitting: Radiation Oncology

## 2013-12-06 ENCOUNTER — Ambulatory Visit: Payer: Medicare Other

## 2013-12-06 VITALS — BP 134/71 | HR 85 | Temp 98.0°F | Resp 20 | Ht 70.0 in | Wt 215.9 lb

## 2013-12-06 DIAGNOSIS — C61 Malignant neoplasm of prostate: Secondary | ICD-10-CM

## 2013-12-06 NOTE — Progress Notes (Signed)
  Radiation Oncology         [phone] ________________________________  Name: [name] MRN: [id number]  Date: 12/06/2013  DOB: 1940-11-21  Follow-Up Visit Note  CC: Delphina Cahill, MD  Malka So, MD    ICD-9-CM ICD-10-CM   1. 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  He is doing well and essentially back to his normal self. He has noticed a slight increase in his urinary frequency but no dysuria or bowel complaints. His energy level is good. He denies any hematuria.                              ALLERGIES:  has No Known Allergies.  Meds: Current Outpatient Prescriptions  Medication Sig Dispense Refill  . amLODipine (NORVASC) 5 MG tablet Take 5 mg by mouth daily.      Marland Kitchen atorvastatin (LIPITOR) 10 MG tablet Take 10 mg by mouth daily.      . calcium-vitamin D (OSCAL WITH D) 500-200 MG-UNIT per tablet Take 1 tablet by mouth daily with breakfast.    . telmisartan-hydrochlorothiazide (MICARDIS HCT) 80-25 MG per tablet Take 1 tablet by mouth daily.    . fexofenadine (ALLEGRA) 180 MG tablet Take 180 mg by mouth daily.     No current facility-administered medications for this encounter.    Physical Findings: The patient is in no acute distress. Patient is alert and oriented.  height is 5\' 10"  (1.778 m) and weight is 215 lb 14.4 oz (97.932 kg). His oral temperature is 98 F (36.7 C). His blood pressure is 134/71 and his pulse is 85. His respiration is 20. .  The lungs are clear to auscultation. The heart has regular rhythm and rate. The abdomen is soft and non tender with normal bowel sounds.  Lab Findings: Lab Results  Component Value Date   WBC 9.0 01/29/2010   HGB 14.4 01/29/2010   HCT 41.2 01/29/2010   MCV 93.0 01/29/2010   PLT 231 01/29/2010     Radiographic Findings: No results found.  Impression:  The patient is recovering from the effects of radiation.    Plan:  Routine followup in 6 months. In the interim the patient will be seen by urology for exam and PSA blood test.  ____________________________________ Blair Promise, MD

## 2013-12-06 NOTE — Progress Notes (Signed)
[name] here for follow up after treatment for prostate cancer.  He denies pain, dysuria, hematuria, diarrhea and fatigue .  He reports his urinary frequency is declining.  He reports his urinary stream is back to normal.  He reports getting up once per night to urinate.  He reports his right knee is feeling a little better.  He is using a wedge in his right shoe.

## 2014-03-27 ENCOUNTER — Encounter: Payer: Self-pay | Admitting: Radiation Oncology

## 2014-03-28 ENCOUNTER — Encounter: Payer: Self-pay | Admitting: Oncology

## 2014-05-30 ENCOUNTER — Ambulatory Visit: Admission: RE | Admit: 2014-05-30 | Payer: Medicare Other | Source: Ambulatory Visit | Admitting: Radiation Oncology

## 2014-06-04 DIAGNOSIS — I10 Essential (primary) hypertension: Secondary | ICD-10-CM | POA: Insufficient documentation

## 2014-06-04 DIAGNOSIS — J45909 Unspecified asthma, uncomplicated: Secondary | ICD-10-CM | POA: Insufficient documentation

## 2014-06-04 DIAGNOSIS — R0989 Other specified symptoms and signs involving the circulatory and respiratory systems: Secondary | ICD-10-CM | POA: Insufficient documentation

## 2014-06-04 DIAGNOSIS — I739 Peripheral vascular disease, unspecified: Secondary | ICD-10-CM | POA: Insufficient documentation

## 2014-10-09 NOTE — H&P (Signed)
TOTAL KNEE ADMISSION H&P  Patient is being admitted for right total knee arthroplasty.  Subjective:  Chief Complaint:   Right knee primary OA / pain.  HPI: [name], 74 y.o. male, has a history of pain and functional disability in the right knee due to arthritis and has failed non-surgical conservative treatments for greater than 12 weeks to includeNSAID's and/or analgesics, corticosteriod injections and activity modification.  Onset of symptoms was gradual, starting 6-7 years ago with gradually worsening course since that time. The patient noted prior procedures on the knee to include  menisectomy on the right knee(s).  Patient currently rates pain in the right knee(s) at 8 out of 10 with activity. Patient has night pain, worsening of pain with activity and weight bearing, pain that interferes with activities of daily living, pain with passive range of motion, crepitus and joint swelling.  Patient has evidence of periarticular osteophytes and joint space narrowing by imaging studies.  There is no active infection.  Risks, benefits and expectations were discussed with the patient.  Risks including but not limited to the risk of anesthesia, blood clots, nerve damage, blood vessel damage, failure of the prosthesis, infection and up to and including death.  Patient understand the risks, benefits and expectations and wishes to proceed with surgery.   PCP: Wende Neighbors, MD  D/C Plans:      Home with HHPT  Post-op Meds:       No Rx given  Tranexamic Acid:      To be given - topically (previous stroke)  Decadron:      Is to be given  FYI:     ASA post-op  Norco post-op    Patient Active Problem List   Diagnosis Date Noted  . Malignant neoplasm of prostate 03/14/2013  . Abnormal ECG 11/18/2010  . Essential hypertension, benign 11/18/2010  . Mixed hyperlipidemia 11/18/2010   Past Medical History  Diagnosis Date  . Obstructive sleep apnea     Compliant with CPAP  . Essential hypertension,  benign   . Osteoarthritis   . Hyperlipidemia   . Intracranial hemorrhage 1/12    Acute right occipital hemorrhage with small right parietal subdural hematoma  . Hemorrhoids   . Prostate cancer   . Radiation 09/06/13-11/06/13    prostate 13 gray    Past Surgical History  Procedure Laterality Date  . Arthroscopic right knee surgery      No prescriptions prior to admission   No Known Allergies   Social History  Substance Use Topics  . Smoking status: Former Smoker -- 0.50 packs/day for 30 years    Types: Cigarettes    Quit date: 03/14/2005  . Smokeless tobacco: Never Used  . Alcohol Use: No    Family History  Problem Relation Age of Onset  . Hypertension    . Cancer Paternal Uncle      Review of Systems  Constitutional: Negative.   HENT: Negative.   Eyes: Negative.   Respiratory: Negative.   Cardiovascular: Negative.   Gastrointestinal: Negative.   Genitourinary: Positive for frequency.  Musculoskeletal: Positive for joint pain.  Skin: Negative.   Neurological: Positive for tremors.  Endo/Heme/Allergies: Positive for environmental allergies.    Objective:  Physical Exam  Constitutional: He is oriented to person, place, and time. He appears well-developed and well-nourished.  HENT:  Head: Normocephalic and atraumatic.  Eyes: Pupils are equal, round, and reactive to light.  Neck: Neck supple. No JVD present. No tracheal deviation present. No thyromegaly present.  Cardiovascular: Normal rate, regular rhythm, normal heart sounds and intact distal pulses.   Respiratory: Effort normal and breath sounds normal. No stridor. No respiratory distress. He has no wheezes.  GI: Soft. There is no tenderness. There is no guarding.  Musculoskeletal:       Right knee: He exhibits decreased range of motion, swelling and bony tenderness. He exhibits no ecchymosis, no deformity, no laceration and no erythema. Tenderness found.  Lymphadenopathy:    He has no cervical adenopathy.   Neurological: He is alert and oriented to person, place, and time.  Skin: Skin is warm and dry.  Psychiatric: He has a normal mood and affect.      Labs:  Estimated body mass index is 30.98 kg/(m^2) as calculated from the following:   Height as of 12/06/13: 5\' 10"  (1.778 m).   Weight as of 12/06/13: 97.932 kg (215 lb 14.4 oz).   Imaging Review Plain radiographs demonstrate severe degenerative joint disease of the right knee(s). The overall alignment is mild varus. The bone quality appears to be good for age and reported activity level.  Assessment/Plan:  End stage arthritis, right knee   The patient history, physical examination, clinical judgment of the provider and imaging studies are consistent with end stage degenerative joint disease of the right knee(s) and total knee arthroplasty is deemed medically necessary. The treatment options including medical management, injection therapy arthroscopy and arthroplasty were discussed at length. The risks and benefits of total knee arthroplasty were presented and reviewed. The risks due to aseptic loosening, infection, stiffness, patella tracking problems, thromboembolic complications and other imponderables were discussed. The patient acknowledged the explanation, agreed to proceed with the plan and consent was signed. Patient is being admitted for inpatient treatment for surgery, pain control, PT, OT, prophylactic antibiotics, VTE prophylaxis, progressive ambulation and ADL's and discharge planning. The patient is planning to be discharged home with home health services.      West Pugh Babish   PA-C  10/09/2014, 2:00 PM

## 2014-10-15 ENCOUNTER — Encounter (HOSPITAL_COMMUNITY): Payer: Self-pay

## 2014-10-15 ENCOUNTER — Encounter (HOSPITAL_COMMUNITY)
Admission: RE | Admit: 2014-10-15 | Discharge: 2014-10-15 | Disposition: A | Discharge status: Home / Self Care | Payer: Medicare Other | Source: Ambulatory Visit | Attending: Orthopedic Surgery | Admitting: Orthopedic Surgery

## 2014-10-15 DIAGNOSIS — Z01818 Encounter for other preprocedural examination: Secondary | ICD-10-CM | POA: Diagnosis not present

## 2014-10-15 DIAGNOSIS — M179 Osteoarthritis of knee, unspecified: Secondary | ICD-10-CM | POA: Insufficient documentation

## 2014-10-15 HISTORY — DX: Headache: R51

## 2014-10-15 HISTORY — DX: Essential tremor: G25.0

## 2014-10-15 HISTORY — DX: Localized edema: R60.0

## 2014-10-15 HISTORY — DX: Headache, unspecified: R51.9

## 2014-10-15 HISTORY — DX: Personal history of other specified conditions: Z87.898

## 2014-10-15 HISTORY — DX: Unspecified urinary incontinence: R32

## 2014-10-15 LAB — URINALYSIS, ROUTINE W REFLEX MICROSCOPIC
Bilirubin Urine: NEGATIVE
GLUCOSE, UA: NEGATIVE mg/dL
KETONES UR: NEGATIVE mg/dL
LEUKOCYTES UA: NEGATIVE
NITRITE: NEGATIVE
PH: 6.5 (ref 5.0–8.0)
Protein, ur: NEGATIVE mg/dL
Specific Gravity, Urine: 1.023 (ref 1.005–1.030)
Urobilinogen, UA: 0.2 mg/dL (ref 0.0–1.0)

## 2014-10-15 LAB — BASIC METABOLIC PANEL
ANION GAP: 9 (ref 5–15)
BUN: 17 mg/dL (ref 6–20)
CO2: 27 mmol/L (ref 22–32)
Calcium: 9.1 mg/dL (ref 8.9–10.3)
Chloride: 102 mmol/L (ref 101–111)
Creatinine, Ser: 0.96 mg/dL (ref 0.61–1.24)
Glucose, Bld: 112 mg/dL — ABNORMAL HIGH (ref 65–99)
Potassium: 3.5 mmol/L (ref 3.5–5.1)
Sodium: 138 mmol/L (ref 135–145)

## 2014-10-15 LAB — URINE MICROSCOPIC-ADD ON

## 2014-10-15 LAB — CBC
HCT: 39.4 % (ref 39.0–52.0)
HEMOGLOBIN: 13.2 g/dL (ref 13.0–17.0)
MCH: 31.7 pg (ref 26.0–34.0)
MCHC: 33.5 g/dL (ref 30.0–36.0)
MCV: 94.5 fL (ref 78.0–100.0)
Platelets: 243 10*3/uL (ref 150–400)
RBC: 4.17 MIL/uL — AB (ref 4.22–5.81)
RDW: 13 % (ref 11.5–15.5)
WBC: 6.8 10*3/uL (ref 4.0–10.5)

## 2014-10-15 LAB — ABO/RH: ABO/RH(D): O POS

## 2014-10-15 LAB — PROTIME-INR
INR: 1.02 (ref 0.00–1.49)
PROTHROMBIN TIME: 13.6 s (ref 11.6–15.2)

## 2014-10-15 LAB — SURGICAL PCR SCREEN
MRSA, PCR: NEGATIVE
Staphylococcus aureus: NEGATIVE

## 2014-10-15 LAB — APTT: APTT: 38 s — AB (ref 24–37)

## 2014-10-15 NOTE — Progress Notes (Signed)
Clearance note per chart per Dr Niue 05/21/2014

## 2014-10-15 NOTE — Patient Instructions (Addendum)
[name]  10/15/2014   Your procedure is scheduled on: Monday October 21, 2014   Report to [facility]  Entrance take [place]  elevators to 3rd floor to  [place] at 10:30 AM.  Call this number if you have problems the morning of surgery [phone]   Remember: ONLY 1 PERSON MAY GO WITH YOU TO SHORT STAY TO GET  READY MORNING OF [place].  Do not eat food After Midnight but may take clear liquids till 6:30 am day of surgery then nothing by mouth.     Take these medicines the morning of surgery with A SIP OF WATER: Amlodipine (Norvasc)                               You may not have any metal on your body including hair pins and              piercings  Do not wear jewelry, lotions, powders or colognes, deodorant                           Men may shave face and neck.   Do not bring valuables to the hospital. [name].  Contacts, dentures or bridgework may not be worn into surgery.  Leave suitcase in the car. After surgery it may be brought to your room.              Please read over the following fact sheets you were given:MRSA INFORMATION SHEET; INCENTIVE SPIROMETER; BLOOD TRANSFUSION INFORMATION SHEET _____________________________________________________________________             [facility] - Preparing for Surgery Before surgery, you can play an important role.  Because skin is not sterile, your skin needs to be as free of germs as possible.  You can reduce the number of germs on your skin by washing with CHG (chlorahexidine gluconate) soap before surgery.  CHG is an antiseptic cleaner which kills germs and bonds with the skin to continue killing germs even after washing. Please DO NOT use if you have an allergy to CHG or antibacterial soaps.  If your skin becomes reddened/irritated stop using the CHG and inform your nurse when you arrive at Short Stay. Do not shave (including legs and  underarms) for at least 48 hours prior to the first CHG shower.  You may shave your face/neck. Please follow these instructions carefully:  1.  Shower with CHG Soap the night before surgery and the  morning of Surgery.  2.  If you choose to wash your hair, wash your hair first as usual with your  normal  shampoo.  3.  After you shampoo, rinse your hair and body thoroughly to remove the  shampoo.                           4.  Use CHG as you would any other liquid soap.  You can apply chg directly  to the skin and wash                       Gently with a scrungie or clean washcloth.  5.  Apply the CHG Soap to your body ONLY FROM THE NECK DOWN.   Do not use on face/ open                           Wound or open sores. Avoid contact with eyes, ears mouth and genitals (private parts).                       Wash face,  Genitals (private parts) with your normal soap.             6.  Wash thoroughly, paying special attention to the area where your surgery  will be performed.  7.  Thoroughly rinse your body with warm water from the neck down.  8.  DO NOT shower/wash with your normal soap after using and rinsing off  the CHG Soap.                9.  Pat yourself dry with a clean towel.            10.  Wear clean pajamas.            11.  Place clean sheets on your bed the night of your first shower and do not  sleep with pets. Day of Surgery : Do not apply any lotions/deodorants the morning of surgery.  Please wear clean clothes to the hospital/surgery center.  FAILURE TO FOLLOW THESE INSTRUCTIONS MAY RESULT IN THE CANCELLATION OF YOUR SURGERY PATIENT SIGNATURE_________________________________  NURSE SIGNATURE__________________________________  ________________________________________________________________________   [name]  An incentive spirometer is a tool that can help keep your lungs clear and active. This tool measures how well you are filling your lungs with each breath. Taking  long deep breaths may help reverse or decrease the chance of developing breathing (pulmonary) problems (especially infection) following:  A long period of time when you are unable to move or be active. BEFORE THE PROCEDURE   If the spirometer includes an indicator to show your best effort, your nurse or respiratory therapist will set it to a desired goal.  If possible, sit up straight or lean slightly forward. Try not to slouch.  Hold the incentive spirometer in an upright position. INSTRUCTIONS FOR USE   Sit on the edge of your bed if possible, or sit up as far as you can in bed or on a chair.  Hold the incentive spirometer in an upright position.  Breathe out normally.  Place the mouthpiece in your mouth and seal your lips tightly around it.  Breathe in slowly and as deeply as possible, raising the piston or the ball toward the top of the column.  Hold your breath for 3-5 seconds or for as long as possible. Allow the piston or ball to fall to the bottom of the column.  Remove the mouthpiece from your mouth and breathe out normally.  Rest for a few seconds and repeat Steps 1 through 7 at least 10 times every 1-2 hours when you are awake. Take your time and take a few normal breaths between deep breaths.  The spirometer may include an indicator to show your best effort. Use the indicator as a goal to work toward during each repetition.  After each set of 10 deep breaths, practice coughing to be sure your lungs are clear. If you have an incision (the cut made at the time of surgery), support your incision when coughing by placing a pillow or  rolled up towels firmly against it. Once you are able to get out of bed, walk around indoors and cough well. You may stop using the incentive spirometer when instructed by your caregiver.  RISKS AND COMPLICATIONS  Take your time so you do not get dizzy or light-headed.  If you are in pain, you may need to take or ask for pain medication before  doing incentive spirometry. It is harder to take a deep breath if you are having pain. AFTER USE  Rest and breathe slowly and easily.  It can be helpful to keep track of a log of your progress. Your caregiver can provide you with a simple table to help with this. If you are using the spirometer at home, follow these instructions: [place] IF:   You are having difficultly using the spirometer.  You have trouble using the spirometer as often as instructed.  Your pain medication is not giving enough relief while using the spirometer.  You develop fever of 100.5 F (38.1 C) or higher. SEEK IMMEDIATE MEDICAL CARE IF:   You cough up bloody sputum that had not been present before.  You develop fever of 102 F (38.9 C) or greater.  You develop worsening pain at or near the incision site. MAKE SURE YOU:   Understand these instructions.  Will watch your condition.  Will get help right away if you are not doing well or get worse. Document Released: 05/17/2006 Document Revised: 03/29/2011 Document Reviewed: 07/18/2006 ExitCare Patient Information 2014 ExitCare, [place].   ________________________________________________________________________  WHAT IS A BLOOD TRANSFUSION? Blood Transfusion Information  A transfusion is the replacement of blood or some of its parts. Blood is made up of multiple cells which provide different functions.  Red blood cells carry oxygen and are used for blood loss replacement.  White blood cells fight against infection.  Platelets control bleeding.  Plasma helps clot blood.  Other blood products are available for specialized needs, such as hemophilia or other clotting disorders. BEFORE THE TRANSFUSION  Who gives blood for transfusions?   Healthy volunteers who are fully evaluated to make sure their blood is safe. This is blood bank blood. Transfusion therapy is the safest it has ever been in the practice of medicine. Before blood is taken  from a donor, a complete history is taken to make sure that person has no history of diseases nor engages in risky social behavior (examples are intravenous drug use or sexual activity with multiple partners). The donor's travel history is screened to minimize risk of transmitting infections, such as malaria. The donated blood is tested for signs of infectious diseases, such as HIV and hepatitis. The blood is then tested to be sure it is compatible with you in order to minimize the chance of a transfusion reaction. If you or a relative donates blood, this is often done in anticipation of surgery and is not appropriate for emergency situations. It takes many days to process the donated blood. RISKS AND COMPLICATIONS Although transfusion therapy is very safe and saves many lives, the main dangers of transfusion include:   Getting an infectious disease.  Developing a transfusion reaction. This is an allergic reaction to something in the blood you were given. Every precaution is taken to prevent this. The decision to have a blood transfusion has been considered carefully by your caregiver before blood is given. Blood is not given unless the benefits outweigh the risks. AFTER THE TRANSFUSION  Right after receiving a blood transfusion, you will usually  feel much better and more energetic. This is especially true if your red blood cells have gotten low (anemic). The transfusion raises the level of the red blood cells which carry oxygen, and this usually causes an energy increase.  The nurse administering the transfusion will monitor you carefully for complications. HOME CARE INSTRUCTIONS  No special instructions are needed after a transfusion. You may find your energy is better. Speak with your caregiver about any limitations on activity for underlying diseases you may have. SEEK MEDICAL CARE IF:   Your condition is not improving after your transfusion.  You develop redness or irritation at the  intravenous (IV) site. SEEK IMMEDIATE MEDICAL CARE IF:  Any of the following symptoms occur over the next 12 hours:  Shaking chills.  You have a temperature by mouth above 102 F (38.9 C), not controlled by medicine.  Chest, back, or muscle pain.  People around you feel you are not acting correctly or are confused.  Shortness of breath or difficulty breathing.  Dizziness and fainting.  You get a rash or develop hives.  You have a decrease in urine output.  Your urine turns a dark color or changes to pink, red, or brown. Any of the following symptoms occur over the next 10 days:  You have a temperature by mouth above 102 F (38.9 C), not controlled by medicine.  Shortness of breath.  Weakness after normal activity.  The white part of the eye turns yellow (jaundice).  You have a decrease in the amount of urine or are urinating less often.  Your urine turns a dark color or changes to pink, red, or brown. Document Released: 01/02/2000 Document Revised: 03/29/2011 Document Reviewed: 08/21/2007 [facility] Patient Information 2014 [place].     CLEAR LIQUID DIET   Foods Allowed                                                                     Foods Excluded  Coffee and tea, regular and decaf                             liquids that you cannot  Plain Jell-O in any flavor                                             see through such as: Fruit ices (not with fruit pulp)                                     milk, soups, orange juice  Iced Popsicles                                    All solid food Carbonated beverages, regular and diet                                    Cranberry, grape  and apple juices Sports drinks like Gatorade Lightly seasoned clear broth or consume(fat free) Sugar, honey syrup  Sample Menu Breakfast                                Lunch                                     Supper Cranberry juice                    Beef broth                             Chicken broth Jell-O                                     Grape juice                           Apple juice Coffee or tea                        Jell-O                                      Popsicle                                                Coffee or tea                        Coffee or tea  _____________________________________________________________________   _______________________________________________________________________

## 2014-10-16 NOTE — Progress Notes (Signed)
Dr Delma Post / anesthesia reviewed pts H&P and EKG / epic 10/15/2014 and EKG per epic 11/18/2010. No orders given. Anesthesia to see pt day of surgery.

## 2014-10-21 ENCOUNTER — Encounter (HOSPITAL_COMMUNITY): Payer: Self-pay | Admitting: *Deleted

## 2014-10-21 ENCOUNTER — Inpatient Hospital Stay (HOSPITAL_COMMUNITY): Payer: Medicare Other | Admitting: Registered Nurse

## 2014-10-21 ENCOUNTER — Encounter (HOSPITAL_COMMUNITY)
Admission: RE | Disposition: A | Discharge status: Home Health | Payer: Self-pay | Source: Ambulatory Visit | Attending: Orthopedic Surgery

## 2014-10-21 ENCOUNTER — Inpatient Hospital Stay (HOSPITAL_COMMUNITY)
Admission: RE | Admit: 2014-10-21 | Discharge: 2014-10-22 | DRG: 470 | Disposition: A | Discharge status: Home Health | Payer: Medicare Other | Source: Ambulatory Visit | Attending: Orthopedic Surgery | Admitting: Orthopedic Surgery

## 2014-10-21 DIAGNOSIS — M1711 Unilateral primary osteoarthritis, right knee: Secondary | ICD-10-CM | POA: Diagnosis present

## 2014-10-21 DIAGNOSIS — Z01812 Encounter for preprocedural laboratory examination: Secondary | ICD-10-CM

## 2014-10-21 DIAGNOSIS — Z8546 Personal history of malignant neoplasm of prostate: Secondary | ICD-10-CM | POA: Diagnosis not present

## 2014-10-21 DIAGNOSIS — G4733 Obstructive sleep apnea (adult) (pediatric): Secondary | ICD-10-CM | POA: Diagnosis present

## 2014-10-21 DIAGNOSIS — E782 Mixed hyperlipidemia: Secondary | ICD-10-CM | POA: Diagnosis present

## 2014-10-21 DIAGNOSIS — Z8673 Personal history of transient ischemic attack (TIA), and cerebral infarction without residual deficits: Secondary | ICD-10-CM

## 2014-10-21 DIAGNOSIS — Z87891 Personal history of nicotine dependence: Secondary | ICD-10-CM

## 2014-10-21 DIAGNOSIS — M659 Synovitis and tenosynovitis, unspecified: Secondary | ICD-10-CM | POA: Diagnosis present

## 2014-10-21 DIAGNOSIS — I10 Essential (primary) hypertension: Secondary | ICD-10-CM | POA: Diagnosis present

## 2014-10-21 DIAGNOSIS — Z96659 Presence of unspecified artificial knee joint: Secondary | ICD-10-CM

## 2014-10-21 DIAGNOSIS — M25561 Pain in right knee: Secondary | ICD-10-CM | POA: Diagnosis present

## 2014-10-21 HISTORY — PX: TOTAL KNEE ARTHROPLASTY: SHX125

## 2014-10-21 LAB — TYPE AND SCREEN
ABO/RH(D): O POS
Antibody Screen: NEGATIVE

## 2014-10-21 SURGERY — ARTHROPLASTY, KNEE, TOTAL
Anesthesia: Spinal | Site: Knee | Laterality: Right

## 2014-10-21 MED ORDER — PHENOL 1.4 % MT LIQD: 1.0000 | OROMUCOSAL | Status: DC | PRN

## 2014-10-21 MED ORDER — FENTANYL CITRATE (PF) 100 MCG/2ML IJ SOLN
INTRAMUSCULAR | Status: DC | PRN
  Administered 2014-10-21: 50 ug via INTRAVENOUS

## 2014-10-21 MED ORDER — SODIUM CHLORIDE 0.9 % IR SOLN
Status: DC | PRN
  Administered 2014-10-21: 1000 mL

## 2014-10-21 MED ORDER — POLYETHYLENE GLYCOL 3350 17 G PO PACK
17.0000 g | PACK | Freq: Two times a day (BID) | ORAL | Status: DC
  Administered 2014-10-21 – 2014-10-22 (×2): 17 g via ORAL

## 2014-10-21 MED ORDER — TRANEXAMIC ACID 1000 MG/10ML IV SOLN
1000.0000 mg | INTRAVENOUS | Status: AC
  Administered 2014-10-21: 1000 mg via INTRAVENOUS
  Filled 2014-10-21: qty 10

## 2014-10-21 MED ORDER — PROPOFOL 10 MG/ML IV BOLUS
INTRAVENOUS | Status: AC
  Filled 2014-10-21: qty 20

## 2014-10-21 MED ORDER — SODIUM CHLORIDE 0.9 % IJ SOLN
INTRAMUSCULAR | Status: DC | PRN
  Administered 2014-10-21: 30 mL

## 2014-10-21 MED ORDER — MIDAZOLAM HCL 5 MG/5ML IJ SOLN
INTRAMUSCULAR | Status: DC | PRN
  Administered 2014-10-21: 2 mg via INTRAVENOUS

## 2014-10-21 MED ORDER — FENTANYL CITRATE (PF) 100 MCG/2ML IJ SOLN
INTRAMUSCULAR | Status: AC
  Filled 2014-10-21: qty 4

## 2014-10-21 MED ORDER — BUPIVACAINE-EPINEPHRINE (PF) 0.25% -1:200000 IJ SOLN
INTRAMUSCULAR | Status: DC | PRN
  Administered 2014-10-21: 30 mL

## 2014-10-21 MED ORDER — IRBESARTAN 300 MG PO TABS
300.0000 mg | ORAL_TABLET | Freq: Every day | ORAL | Status: DC
  Administered 2014-10-21 – 2014-10-22 (×2): 300 mg via ORAL
  Filled 2014-10-21 (×2): qty 1

## 2014-10-21 MED ORDER — CEFAZOLIN SODIUM-DEXTROSE 2-3 GM-% IV SOLR
2.0000 g | INTRAVENOUS | Status: AC
  Administered 2014-10-21: 2 g via INTRAVENOUS

## 2014-10-21 MED ORDER — METHOCARBAMOL 500 MG PO TABS: 500.0000 mg | ORAL_TABLET | Freq: Four times a day (QID) | ORAL | Status: DC | PRN

## 2014-10-21 MED ORDER — DIPHENHYDRAMINE HCL 25 MG PO CAPS: 25.0000 mg | ORAL_CAPSULE | Freq: Four times a day (QID) | ORAL | Status: DC | PRN

## 2014-10-21 MED ORDER — DEXAMETHASONE SODIUM PHOSPHATE 10 MG/ML IJ SOLN
10.0000 mg | Freq: Once | INTRAMUSCULAR | Status: AC
  Administered 2014-10-21: 10 mg via INTRAVENOUS

## 2014-10-21 MED ORDER — ATORVASTATIN CALCIUM 10 MG PO TABS
10.0000 mg | ORAL_TABLET | Freq: Every day | ORAL | Status: DC
  Administered 2014-10-21: 10 mg via ORAL
  Filled 2014-10-21 (×2): qty 1

## 2014-10-21 MED ORDER — MAGNESIUM CITRATE PO SOLN: 1.0000 | Freq: Once | ORAL | Status: DC | PRN

## 2014-10-21 MED ORDER — PROPOFOL 500 MG/50ML IV EMUL
INTRAVENOUS | Status: DC | PRN
  Administered 2014-10-21: 50 ug/kg/min via INTRAVENOUS

## 2014-10-21 MED ORDER — BUPIVACAINE-EPINEPHRINE (PF) 0.25% -1:200000 IJ SOLN
INTRAMUSCULAR | Status: AC
  Filled 2014-10-21: qty 30

## 2014-10-21 MED ORDER — FERROUS SULFATE 325 (65 FE) MG PO TABS
325.0000 mg | ORAL_TABLET | Freq: Three times a day (TID) | ORAL | Status: DC
  Administered 2014-10-22 (×2): 325 mg via ORAL
  Filled 2014-10-21 (×4): qty 1

## 2014-10-21 MED ORDER — TRANEXAMIC ACID 1000 MG/10ML IV SOLN
2000.0000 mg | Freq: Once | INTRAVENOUS | Status: DC
  Filled 2014-10-21: qty 20

## 2014-10-21 MED ORDER — DEXAMETHASONE SODIUM PHOSPHATE 10 MG/ML IJ SOLN
10.0000 mg | Freq: Once | INTRAMUSCULAR | Status: AC
  Administered 2014-10-22: 10 mg via INTRAVENOUS
  Filled 2014-10-21 (×2): qty 1

## 2014-10-21 MED ORDER — METHOCARBAMOL 1000 MG/10ML IJ SOLN
500.0000 mg | Freq: Four times a day (QID) | INTRAVENOUS | Status: DC | PRN
  Administered 2014-10-21: 500 mg via INTRAVENOUS
  Filled 2014-10-21 (×2): qty 5

## 2014-10-21 MED ORDER — METOCLOPRAMIDE HCL 10 MG PO TABS: 5.0000 mg | ORAL_TABLET | Freq: Three times a day (TID) | ORAL | Status: DC | PRN

## 2014-10-21 MED ORDER — FENTANYL CITRATE (PF) 100 MCG/2ML IJ SOLN
INTRAMUSCULAR | Status: AC
  Administered 2014-10-21: 50 ug via INTRAVENOUS
  Filled 2014-10-21: qty 2

## 2014-10-21 MED ORDER — LORATADINE 10 MG PO TABS
10.0000 mg | ORAL_TABLET | Freq: Every day | ORAL | Status: DC
  Administered 2014-10-22: 10 mg via ORAL
  Filled 2014-10-21: qty 1

## 2014-10-21 MED ORDER — DOCUSATE SODIUM 100 MG PO CAPS
100.0000 mg | ORAL_CAPSULE | Freq: Two times a day (BID) | ORAL | Status: DC
  Administered 2014-10-21 – 2014-10-22 (×2): 100 mg via ORAL

## 2014-10-21 MED ORDER — SODIUM CHLORIDE 0.9 % IV SOLN
INTRAVENOUS | Status: DC
  Administered 2014-10-21 – 2014-10-22 (×2): via INTRAVENOUS
  Filled 2014-10-21 (×4): qty 1000

## 2014-10-21 MED ORDER — MIDAZOLAM HCL 2 MG/2ML IJ SOLN
INTRAMUSCULAR | Status: AC
  Filled 2014-10-21: qty 4

## 2014-10-21 MED ORDER — LACTATED RINGERS IV SOLN
INTRAVENOUS | Status: DC
  Administered 2014-10-21: 1000 mL via INTRAVENOUS

## 2014-10-21 MED ORDER — PROMETHAZINE HCL 25 MG/ML IJ SOLN: 6.2500 mg | INTRAMUSCULAR | Status: DC | PRN

## 2014-10-21 MED ORDER — HYDROMORPHONE HCL 1 MG/ML IJ SOLN: 0.5000 mg | INTRAMUSCULAR | Status: DC | PRN

## 2014-10-21 MED ORDER — AMLODIPINE BESYLATE 5 MG PO TABS
5.0000 mg | ORAL_TABLET | Freq: Every day | ORAL | Status: DC
  Administered 2014-10-22: 5 mg via ORAL
  Filled 2014-10-21: qty 1

## 2014-10-21 MED ORDER — SODIUM CHLORIDE 0.9 % IJ SOLN
INTRAMUSCULAR | Status: AC
  Filled 2014-10-21: qty 50

## 2014-10-21 MED ORDER — BISACODYL 10 MG RE SUPP: 10.0000 mg | Freq: Every day | RECTAL | Status: DC | PRN

## 2014-10-21 MED ORDER — LIDOCAINE HCL (CARDIAC) 20 MG/ML IV SOLN
INTRAVENOUS | Status: AC
  Filled 2014-10-21: qty 5

## 2014-10-21 MED ORDER — TELMISARTAN-HCTZ 80-25 MG PO TABS: 1.0000 | ORAL_TABLET | Freq: Every day | ORAL | Status: DC

## 2014-10-21 MED ORDER — BUPIVACAINE IN DEXTROSE 0.75-8.25 % IT SOLN
INTRATHECAL | Status: DC | PRN
  Administered 2014-10-21: 1.4 mL via INTRATHECAL

## 2014-10-21 MED ORDER — ONDANSETRON HCL 4 MG/2ML IJ SOLN
INTRAMUSCULAR | Status: AC
  Filled 2014-10-21: qty 2

## 2014-10-21 MED ORDER — CHLORHEXIDINE GLUCONATE 4 % EX LIQD: 60.0000 mL | Freq: Once | CUTANEOUS | Status: DC

## 2014-10-21 MED ORDER — HYDROCHLOROTHIAZIDE 25 MG PO TABS
25.0000 mg | ORAL_TABLET | Freq: Every day | ORAL | Status: DC
  Administered 2014-10-21 – 2014-10-22 (×2): 25 mg via ORAL
  Filled 2014-10-21 (×2): qty 1

## 2014-10-21 MED ORDER — ALUM & MAG HYDROXIDE-SIMETH 200-200-20 MG/5ML PO SUSP: 30.0000 mL | ORAL | Status: DC | PRN

## 2014-10-21 MED ORDER — FENTANYL CITRATE (PF) 100 MCG/2ML IJ SOLN
25.0000 ug | INTRAMUSCULAR | Status: DC | PRN
  Administered 2014-10-21: 50 ug via INTRAVENOUS

## 2014-10-21 MED ORDER — KETOROLAC TROMETHAMINE 30 MG/ML IJ SOLN
INTRAMUSCULAR | Status: AC
  Filled 2014-10-21: qty 1

## 2014-10-21 MED ORDER — ONDANSETRON HCL 4 MG PO TABS: 4.0000 mg | ORAL_TABLET | Freq: Four times a day (QID) | ORAL | Status: DC | PRN

## 2014-10-21 MED ORDER — ONDANSETRON HCL 4 MG/2ML IJ SOLN: 4.0000 mg | Freq: Four times a day (QID) | INTRAMUSCULAR | Status: DC | PRN

## 2014-10-21 MED ORDER — MENTHOL 3 MG MT LOZG: 1.0000 | LOZENGE | OROMUCOSAL | Status: DC | PRN

## 2014-10-21 MED ORDER — KETOROLAC TROMETHAMINE 30 MG/ML IJ SOLN
INTRAMUSCULAR | Status: DC | PRN
  Administered 2014-10-21: 30 mg via INTRAMUSCULAR

## 2014-10-21 MED ORDER — CEFAZOLIN SODIUM-DEXTROSE 2-3 GM-% IV SOLR
2.0000 g | Freq: Four times a day (QID) | INTRAVENOUS | Status: AC
  Administered 2014-10-21 – 2014-10-22 (×2): 2 g via INTRAVENOUS
  Filled 2014-10-21 (×2): qty 50

## 2014-10-21 MED ORDER — MEPERIDINE HCL 50 MG/ML IJ SOLN: 6.2500 mg | INTRAMUSCULAR | Status: DC | PRN

## 2014-10-21 MED ORDER — FUROSEMIDE 10 MG/ML PO SOLN
10.0000 mg | Freq: Every day | ORAL | Status: DC | PRN
  Filled 2014-10-21: qty 1

## 2014-10-21 MED ORDER — CEFAZOLIN SODIUM-DEXTROSE 2-3 GM-% IV SOLR
INTRAVENOUS | Status: AC
  Filled 2014-10-21: qty 50

## 2014-10-21 MED ORDER — CELECOXIB 200 MG PO CAPS
200.0000 mg | ORAL_CAPSULE | Freq: Two times a day (BID) | ORAL | Status: DC
  Administered 2014-10-21 – 2014-10-22 (×2): 200 mg via ORAL
  Filled 2014-10-21 (×3): qty 1

## 2014-10-21 MED ORDER — EPHEDRINE SULFATE 50 MG/ML IJ SOLN
INTRAMUSCULAR | Status: DC | PRN
  Administered 2014-10-21: 5 mg via INTRAVENOUS

## 2014-10-21 MED ORDER — ASPIRIN EC 325 MG PO TBEC
325.0000 mg | DELAYED_RELEASE_TABLET | Freq: Two times a day (BID) | ORAL | Status: DC
  Administered 2014-10-22: 325 mg via ORAL
  Filled 2014-10-21 (×3): qty 1

## 2014-10-21 MED ORDER — 0.9 % SODIUM CHLORIDE (POUR BTL) OPTIME
TOPICAL | Status: DC | PRN
  Administered 2014-10-21: 1000 mL

## 2014-10-21 MED ORDER — ONDANSETRON HCL 4 MG/2ML IJ SOLN
INTRAMUSCULAR | Status: DC | PRN
  Administered 2014-10-21: 4 mg via INTRAVENOUS

## 2014-10-21 MED ORDER — HYDROCODONE-ACETAMINOPHEN 7.5-325 MG PO TABS
1.0000 | ORAL_TABLET | ORAL | Status: DC
  Administered 2014-10-21: 2 via ORAL
  Administered 2014-10-21 (×2): 1 via ORAL
  Administered 2014-10-22 (×4): 2 via ORAL
  Filled 2014-10-21: qty 2
  Filled 2014-10-21 (×2): qty 1
  Filled 2014-10-21 (×4): qty 2

## 2014-10-21 MED ORDER — METOCLOPRAMIDE HCL 5 MG/ML IJ SOLN: 5.0000 mg | Freq: Three times a day (TID) | INTRAMUSCULAR | Status: DC | PRN

## 2014-10-21 SURGICAL SUPPLY — 54 items
BAG DECANTER FOR FLEXI CONT (MISCELLANEOUS) IMPLANT
BAG ZIPLOCK 12X15 (MISCELLANEOUS) IMPLANT
BANDAGE ELASTIC 6 VELCRO ST LF (GAUZE/BANDAGES/DRESSINGS) ×2 IMPLANT
BANDAGE ESMARK 6X9 LF (GAUZE/BANDAGES/DRESSINGS) ×1 IMPLANT
BLADE SAW SGTL 13.0X1.19X90.0M (BLADE) ×2 IMPLANT
BNDG ESMARK 6X9 LF (GAUZE/BANDAGES/DRESSINGS) ×2
BOWL SMART MIX CTS (DISPOSABLE) ×2 IMPLANT
CAPT KNEE TOTAL 3 ATTUNE ×2 IMPLANT
CEMENT HV SMART SET (Cement) ×4 IMPLANT
CUFF TOURN SGL QUICK 34 (TOURNIQUET CUFF) ×1
CUFF TRNQT CYL 34X4X40X1 (TOURNIQUET CUFF) ×1 IMPLANT
DECANTER SPIKE VIAL GLASS SM (MISCELLANEOUS) ×2 IMPLANT
DRAPE EXTREMITY T 121X128X90 (DRAPE) ×2 IMPLANT
DRAPE POUCH INSTRU U-SHP 10X18 (DRAPES) ×2 IMPLANT
DRAPE U-SHAPE 47X51 STRL (DRAPES) ×2 IMPLANT
DRSG AQUACEL AG ADV 3.5X10 (GAUZE/BANDAGES/DRESSINGS) ×2 IMPLANT
DURAPREP 26ML APPLICATOR (WOUND CARE) ×4 IMPLANT
ELECT REM PT RETURN 9FT ADLT (ELECTROSURGICAL) ×2
ELECTRODE REM PT RTRN 9FT ADLT (ELECTROSURGICAL) ×1 IMPLANT
FACESHIELD WRAPAROUND (MASK) ×10 IMPLANT
GLOVE BIOGEL PI IND STRL 7.5 (GLOVE) ×1 IMPLANT
GLOVE BIOGEL PI IND STRL 8.5 (GLOVE) ×1 IMPLANT
GLOVE BIOGEL PI INDICATOR 7.5 (GLOVE) ×1
GLOVE BIOGEL PI INDICATOR 8.5 (GLOVE) ×1
GLOVE ECLIPSE 8.0 STRL XLNG CF (GLOVE) ×2 IMPLANT
GLOVE ORTHO TXT STRL SZ7.5 (GLOVE) ×4 IMPLANT
GOWN SPEC L3 XXLG W/TWL (GOWN DISPOSABLE) ×2 IMPLANT
GOWN STRL REUS W/TWL LRG LVL3 (GOWN DISPOSABLE) ×2 IMPLANT
HANDPIECE INTERPULSE COAX TIP (DISPOSABLE) ×1
KIT BASIN OR (CUSTOM PROCEDURE TRAY) ×2 IMPLANT
LIQUID BAND (GAUZE/BANDAGES/DRESSINGS) ×2 IMPLANT
MANIFOLD NEPTUNE II (INSTRUMENTS) ×2 IMPLANT
NDL SAFETY ECLIPSE 18X1.5 (NEEDLE) ×2 IMPLANT
NEEDLE HYPO 18GX1.5 SHARP (NEEDLE) ×2
PACK TOTAL JOINT (CUSTOM PROCEDURE TRAY) ×2 IMPLANT
PEN SKIN MARKING BROAD (MISCELLANEOUS) ×2 IMPLANT
POSITIONER SURGICAL ARM (MISCELLANEOUS) ×2 IMPLANT
SET HNDPC FAN SPRY TIP SCT (DISPOSABLE) ×1 IMPLANT
SET PAD KNEE POSITIONER (MISCELLANEOUS) ×2 IMPLANT
SLEEVE SURGEON STRL (DRAPES) ×2 IMPLANT
SUCTION FRAZIER 12FR DISP (SUCTIONS) ×2 IMPLANT
SUT MNCRL AB 4-0 PS2 18 (SUTURE) ×2 IMPLANT
SUT VIC AB 1 CT1 36 (SUTURE) ×2 IMPLANT
SUT VIC AB 2-0 CT1 27 (SUTURE) ×3
SUT VIC AB 2-0 CT1 TAPERPNT 27 (SUTURE) ×3 IMPLANT
SUT VLOC 180 0 24IN GS25 (SUTURE) ×2 IMPLANT
SYR 50ML LL SCALE MARK (SYRINGE) ×2 IMPLANT
SYR CONTROL 10ML LL (SYRINGE) ×2 IMPLANT
TOWEL OR 17X26 10 PK STRL BLUE (TOWEL DISPOSABLE) ×2 IMPLANT
TOWEL OR NON WOVEN STRL DISP B (DISPOSABLE) ×2 IMPLANT
TRAY FOLEY W/METER SILVER 16FR (SET/KITS/TRAYS/PACK) ×2 IMPLANT
WATER STERILE IRR 1500ML POUR (IV SOLUTION) ×2 IMPLANT
WRAP KNEE MAXI GEL POST OP (GAUZE/BANDAGES/DRESSINGS) ×2 IMPLANT
YANKAUER SUCT BULB TIP 10FT TU (MISCELLANEOUS) ×2 IMPLANT

## 2014-10-21 NOTE — Interval H&P Note (Signed)
History and Physical Interval Note:  10/21/2014 11:51 AM  [name]  has presented today for surgery, with the diagnosis of RIGHT KNEE OA   The various methods of treatment have been discussed with the patient and family. After consideration of risks, benefits and other options for treatment, the patient has consented to  Procedure(s): TOTAL RIGHT KNEE ARTHROPLASTY (Right) as a surgical intervention .  The patient's history has been reviewed, patient examined, no change in status, stable for surgery.  I have reviewed the patient's chart and labs.  Questions were answered to the patient's satisfaction.     Mauri Pole

## 2014-10-21 NOTE — Anesthesia Preprocedure Evaluation (Addendum)
Anesthesia Evaluation  Patient identified by MRN, date of birth, ID band Patient awake    Reviewed: Allergy & Precautions, NPO status , Patient's Chart, lab work & pertinent test results  Airway Mallampati: II  TM Distance: >3 FB Neck ROM: Full    Dental no notable dental hx.    Pulmonary sleep apnea , former smoker,    Pulmonary exam normal breath sounds clear to auscultation       Cardiovascular hypertension, Pt. on medications Normal cardiovascular exam Rhythm:Regular Rate:Normal     Neuro/Psych negative neurological ROS  negative psych ROS   GI/Hepatic negative GI ROS, Neg liver ROS,   Endo/Other  negative endocrine ROS  Renal/GU negative Renal ROS  negative genitourinary   Musculoskeletal negative musculoskeletal ROS (+)   Abdominal   Peds negative pediatric ROS (+)  Hematology negative hematology ROS (+)   Anesthesia Other Findings   Reproductive/Obstetrics negative OB ROS                           Anesthesia Physical Anesthesia Plan  ASA: II  Anesthesia Plan: Spinal   Post-op Pain Management:    Induction:   Airway Management Planned: Simple Face Mask  Additional Equipment:   Intra-op Plan:   Post-operative Plan:   Informed Consent: I have reviewed the patients History and Physical, chart, labs and discussed the procedure including the risks, benefits and alternatives for the proposed anesthesia with the patient or authorized representative who has indicated his/her understanding and acceptance.   Dental advisory given  Plan Discussed with: CRNA  Anesthesia Plan Comments:         Anesthesia Quick Evaluation

## 2014-10-21 NOTE — Anesthesia Procedure Notes (Signed)
Procedure Name: MAC Date/Time: 10/21/2014 12:40 PM Performed by: Carleene Cooper A Pre-anesthesia Checklist: Patient identified, Timeout performed, Emergency Drugs available, Suction available and Patient being monitored Patient Re-evaluated:Patient Re-evaluated prior to inductionOxygen Delivery Method: Simple face mask Dental Injury: Teeth and Oropharynx as per pre-operative assessment

## 2014-10-21 NOTE — Transfer of Care (Signed)
Immediate Anesthesia Transfer of Care Note  Patient: [name]  Procedure(s) Performed: Procedure(s): TOTAL RIGHT KNEE ARTHROPLASTY (Right)  Patient Location: PACU  Anesthesia Type:Spinal  Level of Consciousness: sedated  Airway & Oxygen Therapy: Patient Spontanous Breathing and Patient connected to face mask oxygen  Post-op Assessment: Report given to RN and Post -op Vital signs reviewed and stable  Post vital signs: Reviewed and stable  Last Vitals:  Filed Vitals:   10/21/14 1014  BP: 137/80  Pulse: 76  Temp: 36.5 C  Resp: 18    Complications: No apparent anesthesia complications

## 2014-10-21 NOTE — Plan of Care (Signed)
Problem: Consults Goal: Diagnosis- Total Joint Replacement Primary Total Knee     

## 2014-10-21 NOTE — Op Note (Signed)
NAME:  [name]                      MEDICAL RECORD NO.:  [id number]                             FACILITY:  [facility]      PHYSICIAN:  Pietro Cassis. Alvan Dame, M.D.  DATE OF BIRTH:  03-Feb-1940      DATE OF PROCEDURE:  10/21/2014                                     OPERATIVE REPORT         PREOPERATIVE DIAGNOSIS:  Right knee osteoarthritis.      POSTOPERATIVE DIAGNOSIS:  Right knee osteoarthritis.      FINDINGS:  The patient was noted to have complete loss of cartilage and   bone-on-bone arthritis with associated osteophytes in the medial and patellofemoral compartments of   the knee with a significant synovitis and associated effusion.      PROCEDURE:  Right total knee replacement.      COMPONENTS USED:  DePuy rotating platform posterior stabilized knee   system, a size 8 femur, 7 tibia, size 5 mm PS AOX insert, and 41 patellar   button.      SURGEON:  Pietro Cassis. Alvan Dame, M.D.      ASSISTANT:  Danae Orleans, PA-C.      ANESTHESIA:  Spinal.      SPECIMENS:  None.      COMPLICATION:  None.      DRAINS:  None.  EBL: <50cc      TOURNIQUET TIME:   Total Tourniquet Time Documented: Thigh (Right) - 34 minutes Total: Thigh (Right) - 34 minutes  .      The patient was stable to the recovery room.      INDICATION FOR PROCEDURE:  [name] is a 74 y.o. male patient of   mine.  The patient had been seen, evaluated, and treated conservatively in the   office with medication, activity modification, and injections.  The patient had   radiographic changes of bone-on-bone arthritis with endplate sclerosis and osteophytes noted.      The patient failed conservative measures including medication, injections, and activity modification, and at this point was ready for more definitive measures.   Based on the radiographic changes and failed conservative measures, the patient   decided to proceed with total knee replacement.  Risks of infection,   DVT, component failure, need for  revision surgery, postop course, and   expectations were all   discussed and reviewed.  Consent was obtained for benefit of pain   relief.      PROCEDURE IN DETAIL:  The patient was brought to the operative theater.   Once adequate anesthesia, preoperative antibiotics, 2 gm of Ancef, 1 gm of Tranexamic Acid, and 10mg  of decadron administered, the patient was positioned supine with the right thigh tourniquet placed.  The  right lower extremity was prepped and draped in sterile fashion.  A time-   out was performed identifying the patient, planned procedure, and   extremity.      The right lower extremity was placed in the [facility] leg holder.  The leg was   exsanguinated, tourniquet elevated to 250 mmHg.  A midline incision was   made followed by median  parapatellar arthrotomy.  Following initial   exposure, attention was first directed to the patella.  Precut   measurement was noted to be 25-26 mm.  I resected down to 15 mm and used a   41 patellar button to restore patellar height as well as cover the cut   surface.      The lug holes were drilled and a metal shim was placed to protect the   patella from retractors and saw blades.      At this point, attention was now directed to the femur.  The femoral   canal was opened with a drill, irrigated to try to prevent fat emboli.  An   intramedullary rod was passed at 5 degrees valgus, 9 mm of bone was   resected off the distal femur.  Following this resection, the tibia was   subluxated anteriorly.  Using the extramedullary guide, 2 mm of bone was resected off   the proximal medial tibia.  We confirmed the gap would be   stable medially and laterally with size 5 mm insert as well as confirmed   the cut was perpendicular in the coronal plane, checking with an alignment rod.      Once this was done, I sized the femur to be a size 8 in the anterior-   posterior dimension, chose a standard component based on medial and   lateral dimension.   The size 8 rotation block was then pinned in   position anterior referenced using the C-clamp to set rotation.  The   anterior, posterior, and  chamfer cuts were made without difficulty nor   notching making certain that I was along the anterior cortex to help   with flexion gap stability.      The final box cut was made off the lateral aspect of distal femur.      At this point, the tibia was sized to be a size 7, the size 7 tray was   then pinned in position through the medial third of the tubercle,   drilled, and keel punched.  Trial reduction was now carried with a 8 femur,  7 tibia, a size 5 mm insert, and the 41 patella botton.  The knee was brought to   extension, full extension with good flexion stability with the patella   tracking through the trochlea without application of pressure.  Given   all these findings, the trial components removed.  Final components were   opened and cement was mixed.  The knee was irrigated with normal saline   solution and pulse lavage.  The synovial lining was   then injected with 30cc of 0.25% Marcaine with epinephrine and 1 cc of Toradol plus 30 cc of NS for a    total of 61 cc.      The knee was irrigated.  Final implants were then cemented onto clean and   dried cut surfaces of bone with the knee brought to extension with a size 5 mm trial insert.      Once the cement had fully cured, the excess cement was removed   throughout the knee.  I confirmed I was satisfied with the range of   motion and stability, and the final size 5 mm PS AOX insert was chosen.  It was   placed into the knee.      The tourniquet had been let down at 34 minutes.  No significant   hemostasis required.  The   extensor mechanism  was then reapproximated using #1 Vicryl and #0 V-lock sutures with the knee   in flexion.  The   remaining wound was closed with 2-0 Vicryl and running 4-0 Monocryl.   The knee was cleaned, dried, dressed sterilely using Dermabond and    Aquacel dressing.  The patient was then   brought to recovery room in stable condition, tolerating the procedure   well.   Please note that Physician Assistant, Danae Orleans, PA-C, was present for the entirety of the case, and was utilized for pre-operative positioning, peri-operative retractor management, general facilitation of the procedure.  He was also utilized for primary wound closure at the end of the case.              Pietro Cassis Alvan Dame, M.D.    10/21/2014 2:18 PM

## 2014-10-21 NOTE — Plan of Care (Signed)
Problem: Consults Goal: Diagnosis- Total Joint Replacement Outcome: Completed/Met Date Met:  10/21/14 Primary Total Knee

## 2014-10-22 LAB — CBC
HCT: 33.7 % — ABNORMAL LOW (ref 39.0–52.0)
HEMOGLOBIN: 11.2 g/dL — AB (ref 13.0–17.0)
MCH: 31.1 pg (ref 26.0–34.0)
MCHC: 33.2 g/dL (ref 30.0–36.0)
MCV: 93.6 fL (ref 78.0–100.0)
PLATELETS: 211 10*3/uL (ref 150–400)
RBC: 3.6 MIL/uL — AB (ref 4.22–5.81)
RDW: 12.6 % (ref 11.5–15.5)
WBC: 12.9 10*3/uL — AB (ref 4.0–10.5)

## 2014-10-22 LAB — BASIC METABOLIC PANEL
ANION GAP: 7 (ref 5–15)
BUN: 15 mg/dL (ref 6–20)
CHLORIDE: 104 mmol/L (ref 101–111)
CO2: 26 mmol/L (ref 22–32)
Calcium: 8.6 mg/dL — ABNORMAL LOW (ref 8.9–10.3)
Creatinine, Ser: 0.76 mg/dL (ref 0.61–1.24)
GFR calc Af Amer: >60 mL/min (ref >60)
GLUCOSE: 152 mg/dL — AB (ref 65–99)
POTASSIUM: 4.1 mmol/L (ref 3.5–5.1)
SODIUM: 137 mmol/L (ref 135–145)

## 2014-10-22 MED ORDER — ASPIRIN 325 MG PO TBEC: 325.0000 mg | DELAYED_RELEASE_TABLET | Freq: Two times a day (BID) | ORAL | Status: AC

## 2014-10-22 MED ORDER — FERROUS SULFATE 325 (65 FE) MG PO TABS: 325.0000 mg | ORAL_TABLET | Freq: Three times a day (TID) | ORAL | Status: DC

## 2014-10-22 MED ORDER — HYDROCODONE-ACETAMINOPHEN 7.5-325 MG PO TABS: 1.0000 | ORAL_TABLET | ORAL | Status: DC | PRN

## 2014-10-22 MED ORDER — DOCUSATE SODIUM 100 MG PO CAPS: 100.0000 mg | ORAL_CAPSULE | Freq: Two times a day (BID) | ORAL | Status: DC

## 2014-10-22 MED ORDER — TIZANIDINE HCL 4 MG PO TABS: 4.0000 mg | ORAL_TABLET | Freq: Four times a day (QID) | ORAL | Status: DC | PRN

## 2014-10-22 MED ORDER — POLYETHYLENE GLYCOL 3350 17 G PO PACK: 17.0000 g | PACK | Freq: Two times a day (BID) | ORAL | Status: DC

## 2014-10-22 NOTE — Anesthesia Postprocedure Evaluation (Signed)
  Anesthesia Post-op Note  Patient: [name]  Procedure(s) Performed: Procedure(s) (LRB): TOTAL RIGHT KNEE ARTHROPLASTY (Right)  Patient Location: PACU  Anesthesia Type: Spinal  Level of Consciousness: awake and alert   Airway and Oxygen Therapy: Patient Spontanous Breathing  Post-op Pain: mild  Post-op Assessment: Post-op Vital signs reviewed, Patient's Cardiovascular Status Stable, Respiratory Function Stable, Patent Airway and No signs of Nausea or vomiting  Last Vitals:  Filed Vitals:   10/22/14 0620  BP:   Pulse: 70  Temp:   Resp: 16    Post-op Vital Signs: stable   Complications: No apparent anesthesia complications

## 2014-10-22 NOTE — Evaluation (Signed)
Physical Therapy Evaluation Patient Details Name: [name] MRN: [id number] DOB: [birth year] Today's Date: 10/22/2014   History of Present Illness  s/p R  TKA  Clinical Impression  Pt will benefit from PT to address deficits below; will need HHPT, has RW; should progress well, will see for second session this pm    Follow Up Recommendations Home health PT;Supervision for mobility/OOB    Equipment Recommendations  None recommended by PT    Recommendations for Other Services       Precautions / Restrictions Precautions Precautions: Knee Restrictions Weight Bearing Restrictions: No Other Position/Activity Restrictions: WBAT      Mobility  Bed Mobility Overal bed mobility: Needs Assistance Bed Mobility: Supine to Sit     Supine to sit: Min assist     General bed mobility comments: with RLE, cues for technique  Transfers Overall transfer level: Needs assistance Equipment used: Rolling walker (2 wheeled) Transfers: Sit to/from Stand Sit to Stand: Min assist         General transfer comment: cues for hand placement  Ambulation/Gait Ambulation/Gait assistance: Min assist Ambulation Distance (Feet): 80 Feet Assistive device: Rolling walker (2 wheeled) Gait Pattern/deviations: Step-to pattern;Antalgic Gait velocity: decr   General Gait Details: cues for sequence, RW distance from self, posture  Stairs            Wheelchair Mobility    Modified Rankin (Stroke Patients Only)       Balance                                             Pertinent Vitals/Pain Pain Assessment: 0-10 Pain Score: 2  Pain Descriptors / Indicators: Sore Pain Intervention(s): Limited activity within patient's tolerance;Monitored during session;Premedicated before session;Repositioned;Ice applied    Home Living Family/patient expects to be discharged to:: Private residence Living Arrangements: Spouse/significant other Available Help at Discharge:  Family Type of Home: House Home Access: [place]: [place]: Environmental consultant - 2 wheels;Adaptive equipment;Toilet riser (riser doesn't fit toilet ) Additional Comments: dtr  staying to assist x 2-3wks; pt wife is "in w/c"; pt does all cooking and cleaning, wife does laundry    Prior Function Level of Independence: Independent               Hand Dominance        Extremity/Trunk Assessment   Upper Extremity Assessment: Defer to OT evaluation;Overall WFL for tasks assessed           Lower Extremity Assessment: RLE deficits/detail RLE Deficits / Details: ankle WFL,  knee extension and hip flexion grossly 2/5       Communication   Communication: No difficulties  Cognition Arousal/Alertness: Awake/alert Behavior During Therapy: WFL for tasks assessed/performed Overall Cognitive Status: Within Functional Limits for tasks assessed                      General Comments      Exercises Total Joint Exercises Ankle Circles/Pumps: AROM;10 reps;Both Quad Sets: AROM;Both;5 reps      Assessment/Plan    PT Assessment    PT Diagnosis Difficulty walking   PT Problem List    PT Treatment Interventions     PT Goals (Current goals can be found in the Care Plan section) Acute Rehab PT Goals Patient Stated Goal: to return to I, assist in  care of wife PT Goal Formulation: With patient Time For Goal Achievement: 10/24/14 Potential to Achieve Goals: Good    Frequency     Barriers to discharge        Co-evaluation               End of Session Equipment Utilized During Treatment: Gait belt Activity Tolerance: Patient tolerated treatment well Patient left: in chair;with call bell/phone within reach Nurse Communication: Mobility status         Time: 8299-3716 PT Time Calculation (min) (ACUTE ONLY): 28 min   Charges:   PT Evaluation $Initial PT Evaluation Tier I: 1 Procedure PT Treatments $Gait Training: 8-22 mins    PT G Codes:        Maisha Bogen 11/02/2014, 10:13 AM

## 2014-10-22 NOTE — Evaluation (Signed)
Occupational Therapy Evaluation Patient Details Name: [name] MRN: [id number] DOB: [birth year] Today's Date: 10/22/2014    History of Present Illness s/p R  TKA   Clinical Impression   Patient presenting with decreased ADL and functional mobility independence secondary to above. Patient independent PTA. Patient currently functioning at an overall min to mod assist level. Patient will benefit from acute OT to increase overall independence in the areas of ADLs, functional mobility, and overall safety in order to safely discharge home with wife and daughter.     Follow Up Recommendations  No OT follow up;Supervision/Assistance - 24 hour    Equipment Recommendations  3 in 1 bedside comode    Recommendations for Other Services  None at this time    Precautions / Restrictions Precautions Precautions: Knee Precaution Comments: reviewed knee precautions and no pillow under knee Restrictions Weight Bearing Restrictions: Yes Other Position/Activity Restrictions: WBAT    Mobility Bed Mobility General bed mobility comments: Pt found seated in recliner upon OT entering/exiting room, please see PT note for more information  Transfers Overall transfer level: Needs assistance Equipment used: Rolling walker (2 wheeled) Transfers: Sit to/from Stand Sit to Stand: Min assist General transfer comment: Min assist for safety, cues for hand & RLE placement and technique.     Balance Overall balance assessment: Needs assistance Sitting-balance support: No upper extremity supported;Feet supported Sitting balance-Leahy Scale: Good     Standing balance support: Bilateral upper extremity supported;During functional activity Standing balance-Leahy Scale: Fair    ADL Overall ADL's : Needs assistance/impaired Eating/Feeding: Set up;Sitting   Grooming: Set up;Sitting   Upper Body Bathing: Set up;Sitting   Lower Body Bathing: Sit to/from stand;Cueing for safety;Minimal assistance Lower  Body Bathing Details (indicate cue type and reason): can benefit from [facility] sponge Upper Body Dressing : Set up;Sitting   Lower Body Dressing: Moderate assistance;Sit to/from stand;Cueing for safety Lower Body Dressing Details (indicate cue type and reason): can benefit from AE Toilet Transfer: Min guard;RW;BSC;Ambulation     Toileting - Clothing Manipulation Details (indicate cue type and reason): did not occur   Tub/Shower Transfer Details (indicate cue type and reason): did not occur, but discussed technique for in/out of shower Functional mobility during ADLs: Min guard General ADL Comments: Pt somewhat unsteady on feet and relies on RW for functional mobility. Cues for safety during session. Pt ambualted to and from BR for toilet transfer using BSC over toilet seat.     Pertinent Vitals/Pain Pain Assessment: Faces Pain Score: 2  Faces Pain Scale: Hurts a little bit Pain Location: right knee Pain Descriptors / Indicators: Grimacing;Guarding Pain Intervention(s): Monitored during session;Repositioned;Ice applied    Extremity/Trunk Assessment Upper Extremity Assessment Upper Extremity Assessment: Overall WFL for tasks assessed   Lower Extremity Assessment Lower Extremity Assessment: Defer to PT evaluation RLE Deficits / Details: ankle WFL,  knee extension and hip flexion grossly 2/5   Cervical / Trunk Assessment Cervical / Trunk Assessment: Normal   Communication Communication Communication: No difficulties   Cognition Arousal/Alertness: Awake/alert Behavior During Therapy: WFL for tasks assessed/performed Overall Cognitive Status: Within Functional Limits for tasks assessed              Home Living Family/patient expects to be discharged to:: Private residence Living Arrangements: Spouse/significant other;Children (wife uses w/c for mobility, daughter plans to stay with pt for next 3 weeks) Available Help at Discharge: Family Type of Home: House Home Access: Ramped  entrance     Home Layout: One level  Bathroom Shower/Tub: Occupational psychologist: Standard     Home Equipment: Environmental consultant - 2 wheels;Adaptive equipment;Toilet riser Adaptive Equipment: Reacher;Sock aid Additional Comments: dtr  staying to assist x 2-3wks; pt wife is "in w/c"; pt does all cooking and cleaning, wife does laundry      Prior Functioning/Environment Level of Independence: Independent     OT Diagnosis: Generalized weakness;Acute pain   OT Problem List: Decreased strength;Decreased range of motion;Decreased activity tolerance;Impaired balance (sitting and/or standing);Decreased safety awareness;Decreased knowledge of use of DME or AE;Decreased knowledge of precautions;Pain   OT Treatment/Interventions: Self-care/ADL training;Therapeutic exercise;Energy conservation;DME and/or AE instruction;Therapeutic activities;Patient/family education    OT Goals(Current goals can be found in the care plan section) Acute Rehab OT Goals Patient Stated Goal: to return to I, assist in care of wife OT Goal Formulation: With patient Time For Goal Achievement: 11/05/14 Potential to Achieve Goals: Good ADL Goals Pt Will Perform Grooming: with modified independence;standing Pt Will Perform Lower Body Bathing: with modified independence;sit to/from stand Pt Will Perform Lower Body Dressing: with modified independence;sit to/from stand Pt Will Transfer to Toilet: with modified independence;ambulating;bedside commode Pt Will Perform Tub/Shower Transfer: Shower transfer;shower seat;ambulating;rolling walker;with modified independence Additional ADL Goal #1: Pt will be mod I with functional mobility using RW  OT Frequency: Min 2X/week   Barriers to D/C: None known at this time   End of Session Equipment Utilized During Treatment: Surveyor, mining Communication: Other (comment) (need for [facility] )  Activity Tolerance: Patient tolerated treatment well Patient left: in chair;with  call bell/phone within reach   Time: 1020-1043 OT Time Calculation (min): 23 min Charges:  OT General Charges $OT Visit: 1 Procedure OT Evaluation $Initial OT Evaluation Tier I: 1 Procedure OT Treatments $Self Care/Home Management : 8-22 mins  Carleton [place] , MS, OTR/L, CLT Pager: [phone]  10/22/2014, 10:54 AM

## 2014-10-22 NOTE — Discharge Instructions (Signed)

## 2014-10-22 NOTE — Progress Notes (Signed)
   10/22/14 1500  Total Joint Exercises  Ankle Circles/Pumps AROM;10 reps;Both  Quad Sets AROM;10 reps;Both  Heel Slides AAROM;Right;10 reps  Hip ABduction/ADduction AROM;10 reps;AAROM;Right  Straight Leg Raises AAROM;Right;10 reps  PT - End of Session  Equipment Utilized During Treatment Gait belt  Activity Tolerance Patient tolerated treatment well  Patient left in chair;with call bell/phone within reach  Nurse Communication Mobility status  PT - Assessment/Plan  PT Plan Current plan remains appropriate  PT Frequency (ACUTE ONLY) 7X/week  Follow Up Recommendations Home health PT;Supervision for mobility/OOB  PT equipment None recommended by PT  PT Goal Progression  Progress towards PT goals Progressing toward goals  Acute Rehab PT Goals  PT Goal Formulation With patient  Time For Goal Achievement 10/24/14  Potential to Achieve Goals Good  PT General Charges  $$ ACUTE PT VISIT 1 Procedure  PT Treatments  $Gait Training 8-22 mins  $Therapeutic Exercise 8-22 mins

## 2014-10-22 NOTE — Care Management Note (Addendum)
Case Management Note  Patient Details  Name: [name] MRN: [id number] Date of Birth: 02-23-1940  Subjective/Objective:                   TOTAL RIGHT KNEE ARTHROPLASTY (Right) Action/Plan: Discharge planning  Expected Discharge Date:  10/22/14               Expected Discharge Plan:  [place]  In-House Referral:     Discharge planning Services  CM Consult  Post Acute Care Choice:  Home Health Choice offered to:  Patient  DME Arranged:  3-N-1 DME Agency:  [place]:  PT [place] Agency:  [place]  Status of Service:  Completed, signed off  Medicare Important Message Given:    Date Medicare IM Given:    Medicare IM give by:    Date Additional Medicare IM Given:    Additional Medicare Important Message give by:     If discussed at [place] of Stay Meetings, dates discussed:    Additional Comments: Utilization Review complete. CM met with pt in room to offer choice of home health agency.  Pt chooses [facility] to render HHPT.  Address and contact information verified by pt.  Referral called to [facility] rep.  CM called [facility] DME rep, Lecretia to please deliver the 3n1 to room.  Pt has a rolling walker at home.  No other CM needs were communicated. Dellie Catholic, RN 10/22/2014, 11:24 AM

## 2014-10-22 NOTE — Progress Notes (Signed)
Patient ID: [name], male   DOB: 10/16/1940, 74 y.o.   MRN: [id number] Subjective: 1 Day Post-Op Procedure(s) (LRB): TOTAL RIGHT KNEE ARTHROPLASTY (Right)    Patient reports pain as mild to moderate but doing ok this am eating breakfast  Objective:   VITALS:   Filed Vitals:   10/22/14 0941  BP: 132/32  Pulse:   Temp:   Resp:     Neurovascular intact Incision: dressing C/D/I  LABS  Recent Labs  10/22/14 0430  HGB 11.2*  HCT 33.7*  WBC 12.9*  PLT 211     Recent Labs  10/22/14 0430  NA 137  K 4.1  BUN 15  CREATININE 0.76  GLUCOSE 152*    No results for input(s): LABPT, INR in the last 72 hours.   Assessment/Plan: 1 Day Post-Op Procedure(s) (LRB): TOTAL RIGHT KNEE ARTHROPLASTY (Right)   Advance diet Up with therapy Discharge home with home health   RTC in 2 weeks

## 2014-10-24 NOTE — Discharge Summary (Signed)
Physician Discharge Summary  Patient ID: [name] MRN: [id number] DOB/AGE: 73-Oct-1942 73 y.o.  Admit date: 10/21/2014 Discharge date: 10/22/2014   Procedures:  Procedure(s) (LRB): TOTAL RIGHT KNEE ARTHROPLASTY (Right)  Attending Physician:  Dr. Paralee Cancel   Admission Diagnoses:   Right knee primary OA / pain  Discharge Diagnoses:  Active Problems:   S/P knee replacement  Past Medical History  Diagnosis Date  . Essential hypertension, benign   . Osteoarthritis   . Hyperlipidemia   . Intracranial hemorrhage ([place]) 1/12    Acute right occipital hemorrhage with small right parietal subdural hematoma  . Hemorrhoids   . Prostate cancer ([place])   . Radiation 09/06/13-11/06/13    prostate 78 gray  . Lower leg edema     bilat comes and goes has been prescribed furosemide to aide in relieve pt has currently not been using due to causing incontinence as stated per pt   . Obstructive sleep apnea     not using CPAP machine   . Urinary incontinence     with use of furosemide as stated per pt   . H/O urinary frequency     with use of furosemide as stated per pt   . Headache     hx of with [place] 2011  . Essential tremor     hands bilat more in right     HPI:    [name], 74 y.o. male, has a history of pain and functional disability in the right knee due to arthritis and has failed non-surgical conservative treatments for greater than 12 weeks to includeNSAID's and/or analgesics, corticosteriod injections and activity modification. Onset of symptoms was gradual, starting 6-7 years ago with gradually worsening course since that time. The patient noted prior procedures on the knee to include menisectomy on the right knee(s). Patient currently rates pain in the right knee(s) at 8 out of 10 with activity. Patient has night pain, worsening of pain with activity and weight bearing, pain that interferes with activities of daily living, pain with passive range of motion, crepitus and  joint swelling. Patient has evidence of periarticular osteophytes and joint space narrowing by imaging studies. There is no active infection. Risks, benefits and expectations were discussed with the patient. Risks including but not limited to the risk of anesthesia, blood clots, nerve damage, blood vessel damage, failure of the prosthesis, infection and up to and including death. Patient understand the risks, benefits and expectations and wishes to proceed with surgery.   PCP: Anthoney Harada, MD   Discharged Condition: good  Hospital Course:  Patient underwent the above stated procedure on 10/21/2014. Patient tolerated the procedure well and brought to the recovery room in good condition and subsequently to the floor.  POD #1 BP: 115/59 ; Pulse: 68 ; Temp: 97.9 F (36.6 C) ; Resp: 15 Patient reports pain as mild to moderate but doing ok this am eating breakfast. Neurovascular intact and incision: dressing C/D/I  LABS  Basename    HGB  11.2  HCT  33.7    Discharge Exam: General appearance: alert, cooperative and no distress Extremities: Homans sign is negative, no sign of DVT, no edema, redness or tenderness in the calves or thighs and no ulcers, gangrene or trophic changes  Disposition: Home with follow up in 2 weeks   Follow-up Information    Follow up with Mauri Pole, MD. Schedule an appointment as soon as possible for a visit in 2 weeks.   Specialty:  Orthopedic Surgery  Contact information:   [address] [place] [address] [phone]       Follow up with [facility].   Why:  home health physical therapy   Contact information:   [address] [place] [address] [phone]       Follow up with [place].   Why:  3n1 (over the commode seat)   Contact information:   [address] [place]  [address] [phone]       Discharge Instructions    Call MD / Call 911     Complete by:  As directed   If you experience chest pain or shortness of breath, CALL 911 and be transported to the hospital emergency room.  If you develope a fever above 101 F, pus (white drainage) or increased drainage or redness at the wound, or calf pain, call your surgeon's office.     Change dressing    Complete by:  As directed   Maintain surgical dressing until follow up in the clinic. If the edges start to pull up, may reinforce with tape. If the dressing is no longer working, may remove and cover with gauze and tape, but must keep the area dry and clean.  Call with any questions or concerns.     Constipation Prevention    Complete by:  As directed   Drink plenty of fluids.  Prune juice may be helpful.  You may use a stool softener, such as Colace (over the counter) 100 mg twice a day.  Use MiraLax (over the counter) for constipation as needed.     Diet - low sodium heart healthy    Complete by:  As directed      Discharge instructions    Complete by:  As directed   Maintain surgical dressing until follow up in the clinic. If the edges start to pull up, may reinforce with tape. If the dressing is no longer working, may remove and cover with gauze and tape, but must keep the area dry and clean.  Follow up in 2 weeks at [facility]. Call with any questions or concerns.     Increase activity slowly as tolerated    Complete by:  As directed   Weight bearing as tolerated with assist device (walker, cane, etc) as directed, use it as long as suggested by your surgeon or therapist, typically at least 4-6 weeks.     TED hose    Complete by:  As directed   Use stockings (TED hose) for 2 weeks on both leg(s).  You may remove them at night for sleeping.             Medication List    TAKE these medications        amLODipine 5 MG tablet  Commonly known as:  NORVASC  Take 5 mg by mouth daily.     aspirin 325 MG EC tablet  Take 1 tablet (325 mg total) by mouth 2 (two) times  daily.     atorvastatin 10 MG tablet  Commonly known as:  LIPITOR  Take 10 mg by mouth daily.     docusate sodium 100 MG capsule  Commonly known as:  COLACE  Take 1 capsule (100 mg total) by mouth 2 (two) times daily.     ferrous sulfate 325 (65 FE) MG tablet  Take 1 tablet (325 mg total) by mouth 3 (three) times daily after meals.  fexofenadine 180 MG tablet  Commonly known as:  ALLEGRA  Take 180 mg by mouth daily.     furosemide 10 MG/ML solution  Commonly known as:  LASIX  Take 10 mg by mouth daily.     glucosamine-chondroitin 500-400 MG tablet  Take 1 tablet by mouth daily.     HYDROcodone-acetaminophen 7.5-325 MG tablet  Commonly known as:  NORCO  Take 1-2 tablets by mouth every 4 (four) hours as needed for moderate pain.     polyethylene glycol packet  Commonly known as:  MIRALAX / GLYCOLAX  Take 17 g by mouth 2 (two) times daily.     telmisartan-hydrochlorothiazide 80-25 MG tablet  Commonly known as:  MICARDIS HCT  Take 1 tablet by mouth daily.     tiZANidine 4 MG tablet  Commonly known as:  ZANAFLEX  Take 1 tablet (4 mg total) by mouth every 6 (six) hours as needed for muscle spasms.         Signed: [place]. Yulia Ulrich   PA-C  10/24/2014, 11:42 AM

## 2016-07-19 ENCOUNTER — Telehealth: Payer: Self-pay

## 2016-07-19 NOTE — Telephone Encounter (Signed)
ROI fax to [facility]

## 2016-08-24 ENCOUNTER — Encounter: Payer: Self-pay | Admitting: Family Medicine

## 2017-10-19 ENCOUNTER — Ambulatory Visit (INDEPENDENT_AMBULATORY_CARE_PROVIDER_SITE_OTHER): Payer: Medicare Other

## 2017-10-19 ENCOUNTER — Other Ambulatory Visit: Payer: Self-pay

## 2017-10-19 ENCOUNTER — Ambulatory Visit: Payer: Medicare Other | Admitting: Sports Medicine

## 2017-10-19 ENCOUNTER — Encounter: Payer: Self-pay | Admitting: Sports Medicine

## 2017-10-19 VITALS — BP 141/86 | HR 75 | Resp 16 | Ht 68.0 in | Wt 202.0 lb

## 2017-10-19 DIAGNOSIS — M21619 Bunion of unspecified foot: Secondary | ICD-10-CM

## 2017-10-19 DIAGNOSIS — M79671 Pain in right foot: Secondary | ICD-10-CM

## 2017-10-19 DIAGNOSIS — M79672 Pain in left foot: Secondary | ICD-10-CM

## 2017-10-19 NOTE — Progress Notes (Signed)
Subjective: [name] is a 77 y.o. male patient who presents to office for evaluation of Right> Left bunion pain. Patient complains of progressive pain especially over the last 2 to 3 months over the bunion on the right foot that starts as pain over the bump with direct pressure and range of motion; patient now has difficulty fitting shoes comfortably. Ranks pain 4-5/10 and is now interferring with daily activities.  Patient has also tried changing shoes and also saw another doctor for a fracture that he had of the second metatarsal that has now healed who also reported that he needed to have his bunion fixed and recommended him to come to our practice.  Patient states that he loves walking and hiking and that the rubbing of the bump has worsening the pain and discomfort around the area states that even sometimes with tennis shoes he has pain states that the previous doctor told him that he would be up walking back to normal activities in 3 days. Patient reports that he helps to care for his wheelchair-bound wife making meals and doing errands and other activities.  Patient denies any other pedal complaints.   Review of Systems  Musculoskeletal: Positive for joint pain and myalgias.  All other systems reviewed and are negative.    Patient Active Problem List   Diagnosis Date Noted  . S/P knee replacement 10/21/2014  . Hypertension 06/04/2014  . No pulse 06/04/2014  . PVD (peripheral vascular disease) ([place]) 06/04/2014  . Uncomplicated asthma 46/96/2952  . Malignant neoplasm of prostate ([place]) 03/14/2013  . Abnormal ECG 11/18/2010  . Essential hypertension, benign 11/18/2010  . Mixed hyperlipidemia 11/18/2010    Current Outpatient Medications on File Prior to Visit  Medication Sig Dispense Refill  . amLODipine (NORVASC) 5 MG tablet Take 5 mg by mouth daily.      . fexofenadine (ALLEGRA) 180 MG tablet Take 180 mg by mouth daily.     No current facility-administered medications on file prior  to visit.     Allergies  Allergen Reactions  . Pollen Extract Other (See Comments)    Objective:  General: Alert and oriented x3 in no acute distress  Dermatology: No open lesions bilateral lower extremities, no webspace macerations, no ecchymosis bilateral, all nails x 10 are well manicured.  Vascular: Dorsalis Pedis and Posterior Tibial pedal pulses 1/4, Capillary Fill Time 4 seconds, (+) pedal hair growth bilateral, no edema bilateral lower extremities, Temperature gradient within normal limits.  Neurology: Gross sensation intact via light touch bilateral, protective sensation intact with Sims Weinstein monofilament to all pedal sites bilateral.  Vibratory intact bilateral.  Musculoskeletal: Mild tenderness with palpation right bunion deformity, no limitation or crepitus with range of motion, deformity is large but reducible, tracking not trackbound. Midtarsal, Subtalar joint, and ankle joint range of motion is within normal limits. On weightbearing exam, there is decreased 1st MTPJ rom Right>Left with functional limitus noted, there is medial arch collapse Right> Left on weightbearing, rearfoot slight valgus, forefoot slight abduction with HAV deformity supported on ground with no second toe crossover deformity noted.   Gait: Non-Antalgic gait   Xrays  Right/Left Foot    Impression: Intermetatarsal angle above normal limits on right supportive of a large bunion deformity.  No other acute findings.      Assessment and Plan: Problem List Items Addressed This Visit    None    Visit Diagnoses    Bunion    -  Primary   Relevant Orders   DG  Foot Complete Right   DG Foot Complete Left   Right foot pain           -Complete examination performed -Xrays reviewed -Discussed treatement options; discussed HAV deformity;conservative and  Surgical management; risks, benefits, alternatives discussed. All patient's questions answered. -Dispensed bunion padding and advised patient to think  about surgical options and the significant downtime that it requires especially if we plan to do a metatarsal osteotomy.  I advised patient to further discuss this with his daughter and would recommend him to have his daughter come down for the initial few weeks to assist with patient activities and assist with caring for his wife to prevent any postoperative complications -Recommend continue with good supportive shoes and inserts meanwhile.  -Patient to return to office in 1 month or when ready for surgery consult for right bunion or sooner if condition worsens.  Landis Martins, DPM

## 2017-10-19 NOTE — Patient Instructions (Signed)

## 2017-10-19 NOTE — Progress Notes (Signed)
   Subjective:    Patient ID: [name], male    DOB: [birth year], 77 y.o.   MRN: [id number]  HPI    Review of Systems  Musculoskeletal: Positive for arthralgias and myalgias.  All other systems reviewed and are negative.      Objective:   Physical Exam        Assessment & Plan:

## 2017-10-20 ENCOUNTER — Other Ambulatory Visit: Payer: Self-pay | Admitting: Sports Medicine

## 2017-10-20 DIAGNOSIS — M79672 Pain in left foot: Secondary | ICD-10-CM

## 2017-10-20 DIAGNOSIS — M21619 Bunion of unspecified foot: Secondary | ICD-10-CM

## 2017-10-20 DIAGNOSIS — M79671 Pain in right foot: Secondary | ICD-10-CM

## 2017-11-07 ENCOUNTER — Encounter: Payer: Self-pay | Admitting: Podiatry

## 2017-11-07 ENCOUNTER — Ambulatory Visit: Payer: Medicare Other | Admitting: Podiatry

## 2017-11-07 DIAGNOSIS — M21611 Bunion of right foot: Secondary | ICD-10-CM

## 2017-11-13 NOTE — Progress Notes (Signed)
   Subjective: 77 year old male presenting today with a chief complaint of a painful bunion of the right foot that has been worsening over the past 2-3 months. Wearing shoes increases this pain. He has been using a bunion sleeve which helps alleviate the pain minimally and temporarily. Patient is here for further evaluation and treatment.  Past Medical History:  Diagnosis Date  . Essential hypertension, benign   . Essential tremor    hands bilat more in right   . H/O urinary frequency    with use of furosemide as stated per pt   . Headache    hx of with [place] 2011  . Hemorrhoids   . Hyperlipidemia   . Intracranial hemorrhage ([place]) 1/12   Acute right occipital hemorrhage with small right parietal subdural hematoma  . Lower leg edema    bilat comes and goes has been prescribed furosemide to aide in relieve pt has currently not been using due to causing incontinence as stated per pt   . Obstructive sleep apnea    not using CPAP machine   . Osteoarthritis   . Prostate cancer ([place])   . Radiation 09/06/13-11/06/13   prostate 78 gray  . Urinary incontinence    with use of furosemide as stated per pt       Objective: Physical Exam General: The patient is alert and oriented x3 in no acute distress.  Dermatology: Skin is cool, dry and supple bilateral lower extremities. Negative for open lesions or macerations.  Vascular: Palpable pedal pulses bilaterally. No edema or erythema noted. Capillary refill within normal limits.  Neurological: Epicritic and protective threshold grossly intact bilaterally.   Musculoskeletal Exam: Clinical evidence of bunion deformity noted to the respective foot. There is a moderate pain on palpation range of motion of the first MPJ. Lateral deviation of the hallux noted consistent with hallux abductovalgus.  Radiographic Exam from 10/20/17: Increased intermetatarsal angle greater than 15 with a hallux abductus angle greater than 30 noted on AP view. Moderate  degenerative changes noted within the first MPJ.  Assessment: 1. HAV w/ bunion deformity right     Plan of Care:  1. Patient was evaluated. X-Rays reviewed. 2. Today we discussed the conservative versus surgical management of the presenting pathology. The patient opts for surgical management. All possible complications and details of the procedure were explained. All patient questions were answered. No guarantees were expressed or implied. 3. Authorization for surgery was initiated today. Surgery will consist of bunionectomy right.  4. Return to clinic one week post op.      Edrick Kins, DPM [facility]  Dr. Edrick Kins, [address]                                        [place], [place] [address]                Office [phone]  Fax [phone]

## 2018-03-24 ENCOUNTER — Ambulatory Visit: Payer: Medicare Other | Admitting: Neurology

## 2018-03-24 ENCOUNTER — Other Ambulatory Visit: Payer: Self-pay

## 2018-03-24 ENCOUNTER — Encounter: Payer: Self-pay | Admitting: Neurology

## 2018-03-24 VITALS — BP 142/87 | HR 96 | Ht 69.0 in | Wt 212.0 lb

## 2018-03-24 DIAGNOSIS — R1310 Dysphagia, unspecified: Secondary | ICD-10-CM

## 2018-03-24 DIAGNOSIS — R1319 Other dysphagia: Secondary | ICD-10-CM

## 2018-03-24 NOTE — Progress Notes (Signed)
Reason for visit: Dysphagia  Referring physician: Dr. Benson Setting is a 78 y.o. male  History of present illness:  [name] is a 78 year old white male with a history of a right occipital intracranial hemorrhage that occurred in 2012, the etiology of this was never determined.  The patient comes in today with a six-month history of dysphagia for solids only.  The patient denies any problems whatsoever swallowing liquids.  He will generally have troubles while eating meat of certain types such as pork or chicken, the episodes are only occasional, he has only had 8-10 episodes over the last 6 months where he has had difficulty swallowing.  He will indicate that he will chew his food and swallow, he may get a pressure sensation upper chest area and then have a gagging sensation, he generally does not vomit up the food that he is just swallowed.  He denies any changes in speech quality, he denies any other issues such as numbness or weakness of extremities, he has had 2 recent episodes of transient blurred vision lasting about 5 minutes with full resolution.  No visual loss was noted.  The patient denies balance problems or difficulty controlling the bowels or the bladder, he denies headaches, dizziness, or neck pain.  He is sent to this office for further evaluation.  Past Medical History:  Diagnosis Date  . Essential hypertension, benign   . Essential tremor    hands bilat more in right   . H/O urinary frequency    with use of furosemide as stated per pt   . Headache    hx of with [place] 2011  . Hemorrhoids   . Hyperlipidemia   . Intracranial hemorrhage ([place]) 1/12   Acute right occipital hemorrhage with small right parietal subdural hematoma  . Lower leg edema    bilat comes and goes has been prescribed furosemide to aide in relieve pt has currently not been using due to causing incontinence as stated per pt   . Obstructive sleep apnea    not using CPAP machine   . Osteoarthritis    . Prostate cancer ([place])   . Radiation 09/06/13-11/06/13   prostate 78 gray  . Urinary incontinence    with use of furosemide as stated per pt     Past Surgical History:  Procedure Laterality Date  . Arthroscopic right knee surgery    . menicus tear     right knee 30 years ago was surgically repaired  . TOTAL KNEE ARTHROPLASTY Right 10/21/2014   Procedure: TOTAL RIGHT KNEE ARTHROPLASTY;  Surgeon: Paralee Cancel, MD;  Location: [facility];  Service: Orthopedics;  Laterality: Right;    Family History  Problem Relation Age of Onset  . Hypertension Unknown   . Cancer Paternal Uncle     Social history:  reports that he quit smoking about 13 years ago. His smoking use included cigarettes. He has a 12.50 pack-year smoking history. He has never used smokeless tobacco. He reports current alcohol use. He reports that he does not use drugs.  Medications:  Prior to Admission medications   Medication Sig Start Date End Date Taking? Authorizing Provider  amLODipine (NORVASC) 5 MG tablet Take 5 mg by mouth daily.     Yes [provider]  atorvastatin (LIPITOR) 20 MG tablet  08/25/17  Yes [provider]  fexofenadine (ALLEGRA) 180 MG tablet Take 180 mg by mouth daily.   Yes [provider]  mupirocin ointment (BACTROBAN) 2 %  08/10/17  Yes [provider]  telmisartan-hydrochlorothiazide (MICARDIS HCT) 80-12.5 MG tablet  09/26/17  Yes [provider]      Allergies  Allergen Reactions  . Pollen Extract Other (See Comments)    ROS:  Out of a complete 14 system review of symptoms, the patient complains only of the following symptoms, and all other reviewed systems are negative.  Difficulty swallowing Tremor  Blood pressure (!) 142/87, pulse 96, height 5\' 9"  (1.753 m), weight 212 lb (96.2 kg).  Physical Exam  General: The patient is alert and cooperative at the time of the examination.  The patient is moderately obese.  Eyes: Pupils are equal,  round, and reactive to light. Discs are flat bilaterally.  Neck: The neck is supple, no carotid bruits are noted.  Respiratory: The respiratory examination is clear.  Cardiovascular: The cardiovascular examination reveals a regular rate and rhythm, no obvious murmurs or rubs are noted.  Skin: Extremities are with 2+ edema below the knees bilaterally.  Neurologic Exam  Mental status: The patient is alert and oriented x 3 at the time of the examination. The patient has apparent normal recent and remote memory, with an apparently normal attention span and concentration ability.  Cranial nerves: Facial symmetry is present. There is good sensation of the face to pinprick and soft touch bilaterally. The strength of the facial muscles and the muscles to head turning and shoulder shrug are normal bilaterally. Speech is well enunciated, no aphasia or dysarthria is noted. Extraocular movements are full. Visual fields are full. The tongue is midline, and the patient has symmetric elevation of the soft palate. No obvious hearing deficits are noted.  Motor: The motor testing reveals 5 over 5 strength of all 4 extremities. Good symmetric motor tone is noted throughout.  Sensory: Sensory testing is intact to pinprick, soft touch, vibration sensation, and position sense on all 4 extremities, with exception of decreased position sense in the right foot. No evidence of extinction is noted.  Coordination: Cerebellar testing reveals good finger-nose-finger and heel-to-shin bilaterally.  Gait and station: Gait is normal. Tandem gait is normal. Romberg is negative. No drift is seen.  Reflexes: Deep tendon reflexes are symmetric and normal bilaterally. Toes are downgoing bilaterally.   Assessment/Plan:  1.  Esophageal dysphagia  The history given by the patient is most consistent with esophageal dysphagia, he may have an esophageal stricture.  The patient will be sent for an esophagram to evaluate this  issue.  If a stricture is present, a referral to a gastroenterologist will be considered.  Jill Alexanders MD 03/24/2018 8:24 AM  [facility] [address] [place], [place] [address]  Phone [phone] Fax [phone]

## 2018-10-13 ENCOUNTER — Telehealth: Payer: Self-pay | Admitting: *Deleted

## 2018-10-13 NOTE — Telephone Encounter (Signed)
"  I saw Dr. Amalia Hailey back in October of 2019.  I'm due to have surgery but I have a couple of questions to ask.  It's my right foot that has a bunion."

## 2018-10-16 NOTE — Telephone Encounter (Signed)
"  I talked to Dr. Amalia Hailey about a year ago.  He said I'm going to need surgery.  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  My daughter will be coming down from [place] to help with my wife once I have my surgery.  She will not be able to stay an extensive period of time.  How long will I not be able to get around?"  You'll be able to walk.  You'll be wearing an air fracture walker.  You could possibly need to wear the boot for two to three weeks.  After that period of time, you will transition into a surgical shoe.  "When will I know if I can transition into a boot?  Is it based upon how well I'm healing?"  Yes, that is correct.  You will see Dr. Amalia Hailey a week after your surgery and then again the following week after that appointment.  "So he can make a determination at the latter visit about whether I can go into a shoe?"  Yes, that is correct.  "I think you have answered all my questions.  I'll give you a call when I make a decision."  You will need to schedule an appointment with Dr. Amalia Hailey for a consultation and see him before we can schedule you for surgery.  Please keep in mind that he is about a month and a half out with her surgery scheduling.  "I'll call and schedule an appointment when I make a decision."

## 2019-03-02 ENCOUNTER — Other Ambulatory Visit: Payer: Self-pay

## 2019-03-02 ENCOUNTER — Ambulatory Visit: Payer: Medicare PPO | Attending: Internal Medicine

## 2019-03-02 DIAGNOSIS — Z23 Encounter for immunization: Secondary | ICD-10-CM

## 2019-03-02 NOTE — Progress Notes (Signed)
   Covid-19 Vaccination Clinic  Name:  Medora BUGGY    MRN: [id number] DOB: Jun 14, 1940  03/02/2019  Mr. BEVANS was observed post Covid-19 immunization for 15 minutes without incidence. He was provided with Vaccine Information Sheet and instruction to access the V-Safe system.   Mr. REGENOLD was instructed to call 911 with any severe reactions post vaccine: Marland Kitchen Difficulty breathing  . Swelling of your face and throat  . A fast heartbeat  . A bad rash all over your body  . Dizziness and weakness    Immunizations Administered    Name Date Dose VIS Date Route   Pfizer COVID-19 Vaccine 03/02/2019  2:37 PM 0.3 mL 12/29/2018 Intramuscular   Manufacturer: [place]   Lot: [id number]   [place]: [id number]

## 2019-03-24 ENCOUNTER — Ambulatory Visit: Payer: Medicare PPO | Attending: Internal Medicine

## 2019-03-24 DIAGNOSIS — Z23 Encounter for immunization: Secondary | ICD-10-CM | POA: Insufficient documentation

## 2019-03-24 NOTE — Progress Notes (Signed)
   Covid-19 Vaccination Clinic  Name:  ANDRAE KLEBE    MRN: [id number] DOB: January 11, 1941  03/24/2019  Mr. SANANGELO was observed post Covid-19 immunization for 15 minutes without incident. He was provided with Vaccine Information Sheet and instruction to access the V-Safe system.   Mr. BRAIN was instructed to call 911 with any severe reactions post vaccine: Marland Kitchen Difficulty breathing  . Swelling of face and throat  . A fast heartbeat  . A bad rash all over body  . Dizziness and weakness   Immunizations Administered    Name Date Dose VIS Date Route   Pfizer COVID-19 Vaccine 03/24/2019  3:20 PM 0.3 mL 12/29/2018 Intramuscular   Manufacturer: [place]   Lot: [id number]   [place]: [id number]

## 2019-03-25 ENCOUNTER — Ambulatory Visit: Payer: Medicare PPO

## 2019-07-16 ENCOUNTER — Ambulatory Visit: Payer: Self-pay | Admitting: Surgery

## 2019-07-24 DIAGNOSIS — Z8679 Personal history of other diseases of the circulatory system: Secondary | ICD-10-CM | POA: Diagnosis not present

## 2019-07-24 DIAGNOSIS — Z0000 Encounter for general adult medical examination without abnormal findings: Secondary | ICD-10-CM | POA: Diagnosis not present

## 2019-07-24 DIAGNOSIS — Z8546 Personal history of malignant neoplasm of prostate: Secondary | ICD-10-CM | POA: Diagnosis not present

## 2019-07-24 DIAGNOSIS — R7309 Other abnormal glucose: Secondary | ICD-10-CM | POA: Diagnosis not present

## 2019-07-24 DIAGNOSIS — E785 Hyperlipidemia, unspecified: Secondary | ICD-10-CM | POA: Diagnosis not present

## 2019-07-24 DIAGNOSIS — R600 Localized edema: Secondary | ICD-10-CM | POA: Diagnosis not present

## 2019-07-24 DIAGNOSIS — I10 Essential (primary) hypertension: Secondary | ICD-10-CM | POA: Diagnosis not present

## 2019-07-24 DIAGNOSIS — M40209 Unspecified kyphosis, site unspecified: Secondary | ICD-10-CM | POA: Diagnosis not present

## 2019-07-24 DIAGNOSIS — R1310 Dysphagia, unspecified: Secondary | ICD-10-CM | POA: Diagnosis not present

## 2019-07-31 DIAGNOSIS — H40023 Open angle with borderline findings, high risk, bilateral: Secondary | ICD-10-CM | POA: Diagnosis not present

## 2019-08-10 DIAGNOSIS — K610 Anal abscess: Secondary | ICD-10-CM | POA: Diagnosis not present

## 2019-08-10 DIAGNOSIS — K6289 Other specified diseases of anus and rectum: Secondary | ICD-10-CM | POA: Diagnosis not present

## 2019-09-26 DIAGNOSIS — Z8546 Personal history of malignant neoplasm of prostate: Secondary | ICD-10-CM | POA: Diagnosis not present

## 2019-10-03 DIAGNOSIS — Z8546 Personal history of malignant neoplasm of prostate: Secondary | ICD-10-CM | POA: Diagnosis not present

## 2020-01-29 DIAGNOSIS — H2513 Age-related nuclear cataract, bilateral: Secondary | ICD-10-CM | POA: Diagnosis not present

## 2020-01-29 DIAGNOSIS — H524 Presbyopia: Secondary | ICD-10-CM | POA: Diagnosis not present

## 2020-04-21 DIAGNOSIS — H93293 Other abnormal auditory perceptions, bilateral: Secondary | ICD-10-CM | POA: Diagnosis not present

## 2020-04-21 DIAGNOSIS — H6123 Impacted cerumen, bilateral: Secondary | ICD-10-CM | POA: Diagnosis not present

## 2020-07-17 DIAGNOSIS — H43811 Vitreous degeneration, right eye: Secondary | ICD-10-CM | POA: Diagnosis not present

## 2020-07-17 DIAGNOSIS — H2513 Age-related nuclear cataract, bilateral: Secondary | ICD-10-CM | POA: Diagnosis not present

## 2020-07-24 ENCOUNTER — Other Ambulatory Visit (HOSPITAL_COMMUNITY): Payer: Self-pay | Admitting: Family Medicine

## 2020-07-24 DIAGNOSIS — I10 Essential (primary) hypertension: Secondary | ICD-10-CM | POA: Diagnosis not present

## 2020-07-24 DIAGNOSIS — Z8679 Personal history of other diseases of the circulatory system: Secondary | ICD-10-CM | POA: Diagnosis not present

## 2020-07-24 DIAGNOSIS — M40209 Unspecified kyphosis, site unspecified: Secondary | ICD-10-CM | POA: Diagnosis not present

## 2020-07-24 DIAGNOSIS — G250 Essential tremor: Secondary | ICD-10-CM | POA: Diagnosis not present

## 2020-07-24 DIAGNOSIS — R600 Localized edema: Secondary | ICD-10-CM | POA: Diagnosis not present

## 2020-07-24 DIAGNOSIS — Z0000 Encounter for general adult medical examination without abnormal findings: Secondary | ICD-10-CM | POA: Diagnosis not present

## 2020-07-24 DIAGNOSIS — R413 Other amnesia: Secondary | ICD-10-CM | POA: Diagnosis not present

## 2020-07-24 DIAGNOSIS — Z8546 Personal history of malignant neoplasm of prostate: Secondary | ICD-10-CM | POA: Diagnosis not present

## 2020-07-24 DIAGNOSIS — E785 Hyperlipidemia, unspecified: Secondary | ICD-10-CM | POA: Diagnosis not present

## 2020-07-24 DIAGNOSIS — R1310 Dysphagia, unspecified: Secondary | ICD-10-CM

## 2020-07-24 DIAGNOSIS — R7309 Other abnormal glucose: Secondary | ICD-10-CM | POA: Diagnosis not present

## 2020-07-31 ENCOUNTER — Other Ambulatory Visit: Payer: Self-pay

## 2020-07-31 ENCOUNTER — Ambulatory Visit (HOSPITAL_COMMUNITY)
Admission: RE | Admit: 2020-07-31 | Discharge: 2020-07-31 | Disposition: A | Discharge status: Home / Self Care | Payer: Medicare PPO | Source: Ambulatory Visit | Attending: Family Medicine | Admitting: Family Medicine

## 2020-07-31 DIAGNOSIS — R1310 Dysphagia, unspecified: Secondary | ICD-10-CM | POA: Insufficient documentation

## 2020-07-31 DIAGNOSIS — K219 Gastro-esophageal reflux disease without esophagitis: Secondary | ICD-10-CM | POA: Diagnosis not present

## 2020-08-21 DIAGNOSIS — L718 Other rosacea: Secondary | ICD-10-CM | POA: Diagnosis not present

## 2020-08-29 DIAGNOSIS — K5790 Diverticulosis of intestine, part unspecified, without perforation or abscess without bleeding: Secondary | ICD-10-CM | POA: Diagnosis not present

## 2020-08-29 DIAGNOSIS — R1310 Dysphagia, unspecified: Secondary | ICD-10-CM | POA: Diagnosis not present

## 2020-08-29 DIAGNOSIS — R933 Abnormal findings on diagnostic imaging of other parts of digestive tract: Secondary | ICD-10-CM | POA: Diagnosis not present

## 2020-08-29 DIAGNOSIS — K224 Dyskinesia of esophagus: Secondary | ICD-10-CM | POA: Diagnosis not present

## 2020-09-08 DIAGNOSIS — H2513 Age-related nuclear cataract, bilateral: Secondary | ICD-10-CM | POA: Diagnosis not present

## 2020-09-11 ENCOUNTER — Ambulatory Visit: Payer: Medicare PPO | Admitting: Neurology

## 2020-09-11 ENCOUNTER — Encounter: Payer: Self-pay | Admitting: Neurology

## 2020-09-11 VITALS — BP 118/62 | HR 80 | Ht 69.5 in | Wt 205.2 lb

## 2020-09-11 DIAGNOSIS — G250 Essential tremor: Secondary | ICD-10-CM

## 2020-09-11 DIAGNOSIS — E538 Deficiency of other specified B group vitamins: Secondary | ICD-10-CM

## 2020-09-11 DIAGNOSIS — Z113 Encounter for screening for infections with a predominantly sexual mode of transmission: Secondary | ICD-10-CM | POA: Diagnosis not present

## 2020-09-11 DIAGNOSIS — G3184 Mild cognitive impairment, so stated: Secondary | ICD-10-CM | POA: Diagnosis not present

## 2020-09-11 HISTORY — DX: Essential tremor: G25.0

## 2020-09-11 HISTORY — DX: Mild cognitive impairment of uncertain or unknown etiology: G31.84

## 2020-09-11 NOTE — Progress Notes (Signed)
Reason for visit: Mild cognitive impairment  Referring physician: Dr. Benson Setting is a 80 y.o. male  History of present illness:  [name] is a 80 year old right-handed white male with a history of esophageal dysphagia, he was seen here previously in 2020 for this reason.  The patient comes back for another issue at this point.  Over the last 2 years, he has had some gradual change in his short-term memory.  The patient himself has not noticed this is much, but his wife and family have.  They have noticed that he is not remembering recent conversations.  He does not have any word finding issues or problems with misplacing things about the house.  He has not had to give up any activities of daily living because of memory.  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  He sleeps well at night, he has a good energy level during the day, he remains active.  He likes to walk and hike, he may walk at least 5 miles a day.  The patient reports no family history of memory issues.  Over the last several years he has had a tremor affecting both upper extremities, he recalls that his mother had a similar tremor.  He has noted some mild changes in hearing.  He reports no numbness or weakness of the face, arms, or legs, he denies any weakness.  He denies any balance issues or difficulty controlling the bowels or the bladder.  Past Medical History:  Diagnosis Date   Essential hypertension, benign    Essential tremor    hands bilat more in right    H/O urinary frequency    with use of furosemide as stated per pt    Headache    hx of with [place] 2011   Hemorrhoids    Hyperlipidemia    Intracranial hemorrhage ([place]) 1/12   Acute right occipital hemorrhage with small right parietal subdural hematoma   Lower leg edema    bilat comes and goes has been prescribed furosemide to aide in relieve pt has currently  not been using due to causing incontinence as stated per pt    Obstructive sleep apnea    not using CPAP machine    Osteoarthritis    Prostate cancer ([place])    Radiation 09/06/13-11/06/13   prostate 78 gray   Urinary incontinence    with use of furosemide as stated per pt     Past Surgical History:  Procedure Laterality Date   Arthroscopic right knee surgery     menicus tear     right knee 30 years ago was surgically repaired   TOTAL KNEE ARTHROPLASTY Right 10/21/2014   Procedure: TOTAL RIGHT KNEE ARTHROPLASTY;  Surgeon: Paralee Cancel, MD;  Location: [facility];  Service: Orthopedics;  Laterality: Right;    Family History  Problem Relation Age of Onset   Hypertension Other    Cancer Paternal Uncle     Social history:  reports that he quit smoking about 15 years ago. His smoking use included cigarettes. He has a 12.50 pack-year smoking history. He has never used smokeless tobacco. He reports current alcohol use. He reports that he does not use drugs.  Medications:  Prior to Admission medications   Medication Sig Start Date End Date Taking? Authorizing Provider  amLODipine (NORVASC) 5 MG tablet Take 5 mg by mouth daily.     Yes [provider]  atorvastatin (LIPITOR) 20 MG tablet  08/25/17  Yes [provider]  fexofenadine (ALLEGRA) 180 MG tablet Take 180 mg by mouth daily.   Yes [provider]  mupirocin ointment (BACTROBAN) 2 %  08/10/17  Yes [provider]  telmisartan-hydrochlorothiazide (MICARDIS HCT) 80-12.5 MG tablet  09/26/17  Yes [provider]      Allergies  Allergen Reactions   Pollen Extract Other (See Comments)    ROS:  Out of a complete 14 system review of symptoms, the patient complains only of the following symptoms, and all other reviewed systems are negative.  Mild memory problems Dysphagia  Blood pressure 118/62, pulse 80, height 5' 9.5" (1.765 m), weight 205 lb 3.2 oz (93.1 kg).  Physical Exam  General: The  patient is alert and cooperative at the time of the examination.  Eyes: Pupils are equal, round, and reactive to light. Discs are flat bilaterally.  Neck: The neck is supple, no carotid bruits are noted.  Respiratory: The respiratory examination is clear.  Cardiovascular: The cardiovascular examination reveals a regular rate and rhythm, no obvious murmurs or rubs are noted.  Skin: Extremities are without significant edema.  Neurologic Exam  Mental status: The patient is alert and oriented x 3 at the time of the examination. The Mini-Mental status examination done today shows a total score 29/30.  Cranial nerves: Facial symmetry is present. There is good sensation of the face to pinprick and soft touch bilaterally. The strength of the facial muscles and the muscles to head turning and shoulder shrug are normal bilaterally. Speech is well enunciated, no aphasia or dysarthria is noted. Extraocular movements are full. Visual fields are full. The tongue is midline, and the patient has symmetric elevation of the soft palate. No obvious hearing deficits are noted.  Motor: The motor testing reveals 5 over 5 strength of all 4 extremities. Good symmetric motor tone is noted throughout.  Sensory: Sensory testing is intact to pinprick, soft touch, vibration sensation, and position sense on all 4 extremities. No evidence of extinction is noted.  Coordination: Cerebellar testing reveals good finger-nose-finger and heel-to-shin bilaterally.  The patient has a course action type tremor with finger-nose-finger bilaterally, some resting component is seen as well, the tremor is bilaterally symmetric.  Gait and station: Gait is normal. Tandem gait is normal. Romberg is negative. No drift is seen.  Reflexes: Deep tendon reflexes are symmetric, but are depressed bilaterally. Toes are downgoing bilaterally.   Assessment/Plan:  1.  Mild cognitive impairment  2.  Essential tremor  3.  Esophageal  dysphagia  The patient will be set up for MRI of the brain, he has a history of a prior right occipital hemorrhage that occurred in 2012.  We need to exclude the possibility of cerebral amyloid angiopathy.  The patient appears to have an essential tremor.  The patient will be sent for blood work today, we will follow the memory issues over time.  He will follow-up here in 6 months, in the future he can be followed through Dr. Leta Baptist.  Craig Alexanders MD 09/11/2020 11:54 AM  [facility] [address] [place], [place] [address]  Phone [phone] Fax [phone]

## 2020-09-12 LAB — RPR: RPR Ser Ql: NONREACTIVE

## 2020-09-12 LAB — SEDIMENTATION RATE: Sed Rate: 8 mm/hr (ref 0–30)

## 2020-09-12 LAB — VITAMIN B12: Vitamin B-12: 199 pg/mL — ABNORMAL LOW (ref 232–1245)

## 2020-09-15 ENCOUNTER — Other Ambulatory Visit: Payer: Self-pay

## 2020-09-15 ENCOUNTER — Encounter: Payer: Self-pay | Admitting: Neurology

## 2020-09-15 ENCOUNTER — Telehealth: Payer: Self-pay

## 2020-09-15 ENCOUNTER — Ambulatory Visit (INDEPENDENT_AMBULATORY_CARE_PROVIDER_SITE_OTHER): Payer: Medicare PPO | Admitting: Neurology

## 2020-09-15 DIAGNOSIS — E538 Deficiency of other specified B group vitamins: Secondary | ICD-10-CM | POA: Diagnosis not present

## 2020-09-15 HISTORY — DX: Deficiency of other specified B group vitamins: E53.8

## 2020-09-15 MED ORDER — CYANOCOBALAMIN 1000 MCG/ML IJ SOLN
1000.0000 ug | Freq: Once | INTRAMUSCULAR | Status: AC
  Administered 2020-09-15: 1000 ug via INTRAMUSCULAR

## 2020-09-15 NOTE — Progress Notes (Signed)
Vitamin B12 injection was given today.

## 2020-09-15 NOTE — Telephone Encounter (Signed)
-----   Message from Kathrynn Ducking, MD sent at 09/13/2020  9:31 AM EDT ----- Please call the patient, have him come in for a vitamin B12 injection, make sure that he knows to go on 1000 mcg daily of vitamin B12 thereafter. ----- Message ----- From: Lavone Neri Lab Results In Sent: 09/12/2020   5:37 AM EDT To: Kathrynn Ducking, MD

## 2020-09-15 NOTE — Telephone Encounter (Signed)
I called pt. No answer, left a message asking pt to call me back.   

## 2020-09-15 NOTE — Telephone Encounter (Signed)
I called pt and advised of results. He verbalized understanding and will plan to report to the office today for Vit b 12 injection.

## 2020-09-16 DIAGNOSIS — J40 Bronchitis, not specified as acute or chronic: Secondary | ICD-10-CM | POA: Diagnosis not present

## 2020-09-16 DIAGNOSIS — J310 Chronic rhinitis: Secondary | ICD-10-CM | POA: Diagnosis not present

## 2020-09-16 DIAGNOSIS — Z03818 Encounter for observation for suspected exposure to other biological agents ruled out: Secondary | ICD-10-CM | POA: Diagnosis not present

## 2020-09-16 DIAGNOSIS — R059 Cough, unspecified: Secondary | ICD-10-CM | POA: Diagnosis not present

## 2020-09-24 DIAGNOSIS — Z8546 Personal history of malignant neoplasm of prostate: Secondary | ICD-10-CM | POA: Diagnosis not present

## 2020-09-26 ENCOUNTER — Other Ambulatory Visit: Payer: Self-pay

## 2020-09-26 ENCOUNTER — Ambulatory Visit
Admission: RE | Admit: 2020-09-26 | Discharge: 2020-09-26 | Disposition: A | Discharge status: Home / Self Care | Payer: Medicare PPO | Source: Ambulatory Visit | Attending: Neurology | Admitting: Neurology

## 2020-09-26 DIAGNOSIS — G3184 Mild cognitive impairment, so stated: Secondary | ICD-10-CM

## 2020-09-26 DIAGNOSIS — R413 Other amnesia: Secondary | ICD-10-CM | POA: Diagnosis not present

## 2020-09-28 ENCOUNTER — Telehealth: Payer: Self-pay | Admitting: Neurology

## 2020-09-28 NOTE — Telephone Encounter (Signed)
I called the patient.  I reviewed the MRI brain results with him.  There is generalized cortical atrophy that has gotten worse over time.  Mild to moderate small vessel disease is seen.  The patient will let us know if he desires to go on medication for memory in the future, otherwise he will be seen and March 2023.  He will remain on oral vitamin B12 tablets.   MRI brain 09/28/20:  IMPRESSION: This MRI of the brain without contrast shows the following: 1.   Remote right occipital hemorrhagic stroke, unchanged compared to the 2012 MRI. 2.   Moderate generalized cortical atrophy, progressed compared to the 2012 MRI. 3.   T2/FLAIR hyperintense foci in the hemispheres consistent with mild to moderate chronic microvascular ischemic change, mildly progressed compared to the 2012 MRI. 4.   Chronic right maxillary sinusitis.   5.   No acute findings.

## 2020-10-01 DIAGNOSIS — Z8546 Personal history of malignant neoplasm of prostate: Secondary | ICD-10-CM | POA: Diagnosis not present

## 2021-01-08 DIAGNOSIS — H2513 Age-related nuclear cataract, bilateral: Secondary | ICD-10-CM | POA: Diagnosis not present

## 2021-01-27 DIAGNOSIS — G250 Essential tremor: Secondary | ICD-10-CM | POA: Diagnosis not present

## 2021-01-27 DIAGNOSIS — Z8679 Personal history of other diseases of the circulatory system: Secondary | ICD-10-CM | POA: Diagnosis not present

## 2021-01-27 DIAGNOSIS — M40209 Unspecified kyphosis, site unspecified: Secondary | ICD-10-CM | POA: Diagnosis not present

## 2021-01-27 DIAGNOSIS — I10 Essential (primary) hypertension: Secondary | ICD-10-CM | POA: Diagnosis not present

## 2021-01-27 DIAGNOSIS — E785 Hyperlipidemia, unspecified: Secondary | ICD-10-CM | POA: Diagnosis not present

## 2021-01-27 DIAGNOSIS — Z8546 Personal history of malignant neoplasm of prostate: Secondary | ICD-10-CM | POA: Diagnosis not present

## 2021-01-27 DIAGNOSIS — R413 Other amnesia: Secondary | ICD-10-CM | POA: Diagnosis not present

## 2021-01-27 DIAGNOSIS — R7309 Other abnormal glucose: Secondary | ICD-10-CM | POA: Diagnosis not present

## 2021-01-27 DIAGNOSIS — Z96651 Presence of right artificial knee joint: Secondary | ICD-10-CM | POA: Diagnosis not present

## 2021-03-23 ENCOUNTER — Ambulatory Visit: Payer: Medicare PPO | Admitting: Neurology

## 2021-03-24 ENCOUNTER — Ambulatory Visit: Payer: Medicare PPO | Admitting: Neurology

## 2021-03-24 DIAGNOSIS — Z8546 Personal history of malignant neoplasm of prostate: Secondary | ICD-10-CM | POA: Diagnosis not present

## 2021-03-24 NOTE — Progress Notes (Unsigned)
PATIENT: [name] DOB: 08-Jun-1940  REASON FOR VISIT: Follow up for memory HISTORY FROM: Patient PRIMARY NEUROLOGIST: Dr. Leta Baptist   HISTORY OF PRESENT ILLNESS: Today 03/24/21 [name] here today for follow-up for memory loss.  B12 was low 199, he got an injection, now on oral supplement, sed rate and RPR were normal.  MRI of the brain showed generalized cortical atrophy that has worsened over time.  Mild to moderate SVD.  Has history of right occipital hemorrhagic stroke in 2012.   HISTORY  09/11/2020 Dr. Jannifer Franklin: [name] is a 81 year old right-handed white male with a history of esophageal dysphagia, he was seen here previously in 2020 for this reason.  The patient comes back for another issue at this point.  Over the last 2 years, he has had some gradual change in his short-term memory.  The patient himself has not noticed this is much, but his wife and family have.  They have noticed that he is not remembering recent conversations.  He does not have any word finding issues or problems with misplacing things about the house.  He has not had to give up any activities of daily living because of memory.  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  He sleeps well at night, he has a good energy level during the day, he remains active.  He likes to walk and hike, he may walk at least 5 miles a day.  The patient reports no family history of memory issues.  Over the last several years he has had a tremor affecting both upper extremities, he recalls that his mother had a similar tremor.  He has noted some mild changes in hearing.  He reports no numbness or weakness of the face, arms, or legs, he denies any weakness.  He denies any balance issues or difficulty controlling the bowels or the bladder.  REVIEW OF SYSTEMS: Out of a complete 14 system review of symptoms, the patient complains only of the  following symptoms, and all other reviewed systems are negative.  ALLERGIES: Allergies  Allergen Reactions   Pollen Extract Other (See Comments)    HOME MEDICATIONS: Outpatient Medications Prior to Visit  Medication Sig Dispense Refill   amLODipine (NORVASC) 5 MG tablet Take 5 mg by mouth daily.       atorvastatin (LIPITOR) 20 MG tablet      fexofenadine (ALLEGRA) 180 MG tablet Take 180 mg by mouth daily.     mupirocin ointment (BACTROBAN) 2 %      telmisartan-hydrochlorothiazide (MICARDIS HCT) 80-12.5 MG tablet      No facility-administered medications prior to visit.    PAST MEDICAL HISTORY: Past Medical History:  Diagnosis Date   Essential hypertension, benign    Essential tremor    hands bilat more in right    H/O urinary frequency    with use of furosemide as stated per pt    Headache    hx of with ICH 2011   Hemorrhoids    Hyperlipidemia    Intracranial hemorrhage (HCC) 1/12   Acute right occipital hemorrhage with small right parietal subdural hematoma   Lower leg edema    bilat comes and goes has been prescribed furosemide to aide in relieve pt has currently not been using due to causing incontinence as stated per pt    MCI (mild cognitive impairment) 09/11/2020   Obstructive sleep apnea    not using CPAP  machine    Osteoarthritis    Prostate cancer ([place])    Radiation 09/06/13-11/06/13   prostate 78 gray   Tremor, essential 09/11/2020   Urinary incontinence    with use of furosemide as stated per pt    Vitamin B12 deficiency 09/15/2020    PAST SURGICAL HISTORY: Past Surgical History:  Procedure Laterality Date   Arthroscopic right knee surgery     menicus tear     right knee 30 years ago was surgically repaired   TOTAL KNEE ARTHROPLASTY Right 10/21/2014   Procedure: TOTAL RIGHT KNEE ARTHROPLASTY;  Surgeon: Paralee Cancel, MD;  Location: [facility];  Service: Orthopedics;  Laterality: Right;    FAMILY HISTORY: Family History  Problem Relation Age of Onset    Hypertension Other    Cancer Paternal Uncle     SOCIAL HISTORY: Social History   Socioeconomic History   Marital status: Married    Spouse name: Opal Sidles   Number of children: 2   Years of education: Not on file   Highest education level: Master's degree (e.g., MA, MS, MEng, MEd, MSW, MBA)  Occupational History   Occupation: Retired    Comment: Recruitment consultant: RETIRED  Tobacco Use   Smoking status: Former    Packs/day: 0.50    Years: 25.00    Pack years: 12.50    Types: Cigarettes    Quit date: 03/14/2005    Years since quitting: 16.0   Smokeless tobacco: Never  Substance and Sexual Activity   Alcohol use: Yes    Comment: occas beer - more in summer-6pk per wk   Drug use: No   Sexual activity: Not on file  Other Topics Concern   Not on file  Social History Narrative   09/11/20 Lives with wife   Social Determinants of Health   Financial Resource Strain: Not on file  Food Insecurity: Not on file  Transportation Needs: Not on file  Physical Activity: Not on file  Stress: Not on file  Social Connections: Not on file  Intimate Partner Violence: Not on file      PHYSICAL EXAM  There were no vitals filed for this visit. There is no height or weight on file to calculate BMI.  Generalized: Well developed, in no acute distress   Neurological examination  Mentation: Alert oriented to time, place, history taking. Follows all commands speech and language fluent Cranial nerve II-XII: Pupils were equal round reactive to light. Extraocular movements were full, visual field were full on confrontational test. Facial sensation and strength were normal. Uvula tongue midline. Head turning and shoulder shrug  were normal and symmetric. Motor: The motor testing reveals 5 over 5 strength of all 4 extremities. Good symmetric motor tone is noted throughout.  Sensory: Sensory testing is intact to soft touch on all 4 extremities. No evidence of extinction is noted.  Coordination:  Cerebellar testing reveals good finger-nose-finger and heel-to-shin bilaterally.  Gait and station: Gait is normal. Tandem gait is normal. Romberg is negative. No drift is seen.  Reflexes: Deep tendon reflexes are symmetric and normal bilaterally.   DIAGNOSTIC DATA (LABS, IMAGING, TESTING) - I reviewed patient records, labs, notes, testing and imaging myself where available.  Lab Results  Component Value Date   WBC 12.9 (H) 10/22/2014   HGB 11.2 (L) 10/22/2014   HCT 33.7 (L) 10/22/2014   MCV 93.6 10/22/2014   PLT 211 10/22/2014      Component Value Date/Time   NA 137 10/22/2014 0430  K 4.1 10/22/2014 0430   CL 104 10/22/2014 0430   CO2 26 10/22/2014 0430   GLUCOSE 152 (H) 10/22/2014 0430   BUN 15 10/22/2014 0430   CREATININE 0.76 10/22/2014 0430   CALCIUM 8.6 (L) 10/22/2014 0430   PROT 6.7 01/28/2010 0149   ALBUMIN 3.6 01/28/2010 0149   AST 17 01/28/2010 0149   ALT 17 01/28/2010 0149   ALKPHOS 65 01/28/2010 0149   BILITOT 0.6 01/28/2010 0149   GFRNONAA >60 10/22/2014 0430   GFRAA >60 10/22/2014 0430   Lab Results  Component Value Date   CHOL  01/29/2010    195        ATP III CLASSIFICATION:  <200     mg/dL   Desirable  200-239  mg/dL   Borderline High  >=240    mg/dL   High          HDL 40 01/29/2010   LDLCALC (H) 01/29/2010    138        Total Cholesterol/HDL:CHD Risk Coronary Heart Disease Risk Table                     Men   Women  1/2 Average Risk   3.4   3.3  Average Risk       5.0   4.4  2 X Average Risk   9.6   7.1  3 X Average Risk  23.4   11.0        Use the calculated Patient Ratio above and the CHD Risk Table to determine the patient's CHD Risk.        ATP III CLASSIFICATION (LDL):  <100     mg/dL   Optimal  100-129  mg/dL   Near or Above                    Optimal  130-159  mg/dL   Borderline  160-189  mg/dL   High  >190     mg/dL   Very High   TRIG 84 01/29/2010   CHOLHDL 4.9 01/29/2010   Lab Results  Component Value Date   HGBA1C  (H) 01/28/2010    5.7 (NOTE)                                                                       According to the ADA Clinical Practice Recommendations for 2011, when HbA1c is used as a screening test:   >=6.5%   Diagnostic of Diabetes Mellitus           (if abnormal result  is confirmed)  5.7-6.4%   Increased risk of developing Diabetes Mellitus  References:Diagnosis and Classification of Diabetes Mellitus,Diabetes TGGY,6948,54(OEVOJ 1):S62-S69 and Standards of Medical Care in         Diabetes - 2011,Diabetes Care,2011,34  (Suppl 1):S11-S61.   Lab Results  Component Value Date   VITAMINB12 199 (L) 09/11/2020   Lab Results  Component Value Date   TSH 3.720 01/28/2010      ASSESSMENT AND PLAN 81 y.o. year old male       Evangeline Dakin, [place] 03/24/2021, 5:27 AM [facility] [address] [place], [place] [address] [phone]

## 2021-03-30 DIAGNOSIS — Z8546 Personal history of malignant neoplasm of prostate: Secondary | ICD-10-CM | POA: Diagnosis not present

## 2021-07-14 DIAGNOSIS — H40023 Open angle with borderline findings, high risk, bilateral: Secondary | ICD-10-CM | POA: Diagnosis not present

## 2021-07-14 DIAGNOSIS — H02831 Dermatochalasis of right upper eyelid: Secondary | ICD-10-CM | POA: Diagnosis not present

## 2021-07-14 DIAGNOSIS — H5213 Myopia, bilateral: Secondary | ICD-10-CM | POA: Diagnosis not present

## 2021-07-14 DIAGNOSIS — H25813 Combined forms of age-related cataract, bilateral: Secondary | ICD-10-CM | POA: Diagnosis not present

## 2021-09-15 NOTE — Progress Notes (Deleted)
Patient: [name] Date of Birth: [birth year]  Reason for Visit: Follow up History from: Patient Primary Neurologist: Willis/Athar    ASSESSMENT AND PLAN 81 y.o. year old male   57.  Mild cognitive impairment 2.  Essential tremor 3.  Esophageal dysphagia   HISTORY OF PRESENT ILLNESS: Today 09/15/21 [name] is here today for follow-up for his memory.  MRI of the brain in September 2022 showed generalized cortical atrophy that has increased over time.  Mild to moderate small vessel disease.  His B12 level was low 199.  HISTORY  09/11/20 Dr. Jannifer Franklin: Mr. [name] is a 81 year old right-handed white male with a history of esophageal dysphagia, he was seen here previously in 2020 for this reason.  The patient comes back for another issue at this point.  Over the last 2 years, he has had some gradual change in his short-term memory.  The patient himself has not noticed this is much, but his wife and family have.  They have noticed that he is not remembering recent conversations.  He does not have any word finding issues or problems with misplacing things about the house.  He has not had to give up any activities of daily living because of memory.  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  He sleeps well at night, he has a good energy level during the day, he remains active.  He likes to walk and hike, he may walk at least 5 miles a day.  The patient reports no family history of memory issues.  Over the last several years he has had a tremor affecting both upper extremities, he recalls that his mother had a similar tremor.  He has noted some mild changes in hearing.  He reports no numbness or weakness of the face, arms, or legs, he denies any weakness.  He denies any balance issues or difficulty controlling the bowels or the bladder.  REVIEW OF SYSTEMS: Out of a complete 14 system review of symptoms,  the patient complains only of the following symptoms, and all other reviewed systems are negative.  See HPI  ALLERGIES: Allergies  Allergen Reactions   Pollen Extract Other (See Comments)    HOME MEDICATIONS: Outpatient Medications Prior to Visit  Medication Sig Dispense Refill   amLODipine (NORVASC) 5 MG tablet Take 5 mg by mouth daily.       atorvastatin (LIPITOR) 20 MG tablet      fexofenadine (ALLEGRA) 180 MG tablet Take 180 mg by mouth daily.     mupirocin ointment (BACTROBAN) 2 %      telmisartan-hydrochlorothiazide (MICARDIS HCT) 80-12.5 MG tablet      No facility-administered medications prior to visit.    PAST MEDICAL HISTORY: Past Medical History:  Diagnosis Date   Essential hypertension, benign    Essential tremor    hands bilat more in right    H/O urinary frequency    with use of furosemide as stated per pt    Headache    hx of with ICH 2011   Hemorrhoids    Hyperlipidemia    Intracranial hemorrhage (HCC) 1/12   Acute right occipital hemorrhage with small right parietal subdural hematoma   Lower leg edema    bilat comes and goes has been prescribed furosemide to aide in relieve pt has currently not been using due to causing incontinence as stated per pt    MCI (mild cognitive impairment) 09/11/2020  Obstructive sleep apnea    not using CPAP machine    Osteoarthritis    Prostate cancer ([place])    Radiation 09/06/13-11/06/13   prostate 78 gray   Tremor, essential 09/11/2020   Urinary incontinence    with use of furosemide as stated per pt    Vitamin B12 deficiency 09/15/2020    PAST SURGICAL HISTORY: Past Surgical History:  Procedure Laterality Date   Arthroscopic right knee surgery     menicus tear     right knee 30 years ago was surgically repaired   TOTAL KNEE ARTHROPLASTY Right 10/21/2014   Procedure: TOTAL RIGHT KNEE ARTHROPLASTY;  Surgeon: Paralee Cancel, MD;  Location: [facility];  Service: Orthopedics;  Laterality: Right;    FAMILY HISTORY: Family  History  Problem Relation Age of Onset   Hypertension Other    Cancer Paternal Uncle     SOCIAL HISTORY: Social History   Socioeconomic History   Marital status: Married    Spouse name: Opal Sidles   Number of children: 2   Years of education: Not on file   Highest education level: Master's degree (e.g., MA, MS, MEng, MEd, MSW, MBA)  Occupational History   Occupation: Retired    Comment: Recruitment consultant: RETIRED  Tobacco Use   Smoking status: Former    Packs/day: 0.50    Years: 25.00    Total pack years: 12.50    Types: Cigarettes    Quit date: 03/14/2005    Years since quitting: 16.5   Smokeless tobacco: Never  Substance and Sexual Activity   Alcohol use: Yes    Comment: occas beer - more in summer-6pk per wk   Drug use: No   Sexual activity: Not on file  Other Topics Concern   Not on file  Social History Narrative   09/11/20 Lives with wife   Social Determinants of Health   Financial Resource Strain: Not on file  Food Insecurity: Not on file  Transportation Needs: Not on file  Physical Activity: Not on file  Stress: Not on file  Social Connections: Not on file  Intimate Partner Violence: Not on file    PHYSICAL EXAM  There were no vitals filed for this visit. There is no height or weight on file to calculate BMI.  Generalized: Well developed, in no acute distress  Neurological examination  Mentation: Alert oriented to time, place, history taking. Follows all commands speech and language fluent Cranial nerve II-XII: Pupils were equal round reactive to light. Extraocular movements were full, visual field were full on confrontational test. Facial sensation and strength were normal. Uvula tongue midline. Head turning and shoulder shrug  were normal and symmetric. Motor: The motor testing reveals 5 over 5 strength of all 4 extremities. Good symmetric motor tone is noted throughout.  Sensory: Sensory testing is intact to soft touch on all 4 extremities. No evidence  of extinction is noted.  Coordination: Cerebellar testing reveals good finger-nose-finger and heel-to-shin bilaterally.  Gait and station: Gait is normal. Tandem gait is normal. Romberg is negative. No drift is seen.  Reflexes: Deep tendon reflexes are symmetric and normal bilaterally.   DIAGNOSTIC DATA (LABS, IMAGING, TESTING) - I reviewed patient records, labs, notes, testing and imaging myself where available.  Lab Results  Component Value Date   WBC 12.9 (H) 10/22/2014   HGB 11.2 (L) 10/22/2014   HCT 33.7 (L) 10/22/2014   MCV 93.6 10/22/2014   PLT 211 10/22/2014      Component Value Date/Time  NA 137 10/22/2014 0430   K 4.1 10/22/2014 0430   CL 104 10/22/2014 0430   CO2 26 10/22/2014 0430   GLUCOSE 152 (H) 10/22/2014 0430   BUN 15 10/22/2014 0430   CREATININE 0.76 10/22/2014 0430   CALCIUM 8.6 (L) 10/22/2014 0430   PROT 6.7 01/28/2010 0149   ALBUMIN 3.6 01/28/2010 0149   AST 17 01/28/2010 0149   ALT 17 01/28/2010 0149   ALKPHOS 65 01/28/2010 0149   BILITOT 0.6 01/28/2010 0149   GFRNONAA >60 10/22/2014 0430   GFRAA >60 10/22/2014 0430   Lab Results  Component Value Date   CHOL  01/29/2010    195        ATP III CLASSIFICATION:  <200     mg/dL   Desirable  200-239  mg/dL   Borderline High  >=240    mg/dL   High          HDL 40 01/29/2010   LDLCALC (H) 01/29/2010    138        Total Cholesterol/HDL:CHD Risk Coronary Heart Disease Risk Table                     Men   Women  1/2 Average Risk   3.4   3.3  Average Risk       5.0   4.4  2 X Average Risk   9.6   7.1  3 X Average Risk  23.4   11.0        Use the calculated Patient Ratio above and the CHD Risk Table to determine the patient's CHD Risk.        ATP III CLASSIFICATION (LDL):  <100     mg/dL   Optimal  100-129  mg/dL   Near or Above                    Optimal  130-159  mg/dL   Borderline  160-189  mg/dL   High  >190     mg/dL   Very High   TRIG 84 01/29/2010   CHOLHDL 4.9 01/29/2010   Lab  Results  Component Value Date   HGBA1C (H) 01/28/2010    5.7 (NOTE)                                                                       According to the ADA Clinical Practice Recommendations for 2011, when HbA1c is used as a screening test:   >=6.5%   Diagnostic of Diabetes Mellitus           (if abnormal result  is confirmed)  5.7-6.4%   Increased risk of developing Diabetes Mellitus  References:Diagnosis and Classification of Diabetes Mellitus,Diabetes PJAS,5053,97(QBHAL 1):S62-S69 and Standards of Medical Care in         Diabetes - 2011,Diabetes Care,2011,34  (Suppl 1):S11-S61.   Lab Results  Component Value Date   VITAMINB12 199 (L) 09/11/2020   Lab Results  Component Value Date   TSH 3.720 01/28/2010    Butler Denmark, AGNP-C, DNP 09/15/2021, 4:06 PM [facility] [address], [place], [place] [address] [phone]

## 2021-09-16 ENCOUNTER — Encounter: Payer: Self-pay | Admitting: Neurology

## 2021-09-16 ENCOUNTER — Ambulatory Visit: Payer: Medicare PPO | Admitting: Neurology

## 2021-10-22 ENCOUNTER — Ambulatory Visit: Payer: Medicare PPO | Admitting: Neurology

## 2021-12-09 ENCOUNTER — Ambulatory Visit: Payer: Medicare PPO | Admitting: Interventional Cardiology

## 2021-12-25 DIAGNOSIS — R251 Tremor, unspecified: Secondary | ICD-10-CM | POA: Diagnosis not present

## 2021-12-25 DIAGNOSIS — R29818 Other symptoms and signs involving the nervous system: Secondary | ICD-10-CM | POA: Diagnosis not present

## 2021-12-25 DIAGNOSIS — R413 Other amnesia: Secondary | ICD-10-CM | POA: Diagnosis not present

## 2021-12-25 DIAGNOSIS — R7989 Other specified abnormal findings of blood chemistry: Secondary | ICD-10-CM | POA: Diagnosis not present

## 2021-12-28 DIAGNOSIS — R9401 Abnormal electroencephalogram [EEG]: Secondary | ICD-10-CM | POA: Diagnosis not present

## 2021-12-28 DIAGNOSIS — R29818 Other symptoms and signs involving the nervous system: Secondary | ICD-10-CM | POA: Diagnosis not present

## 2022-01-29 ENCOUNTER — Emergency Department (HOSPITAL_COMMUNITY): Payer: Medicare PPO

## 2022-01-29 ENCOUNTER — Emergency Department (HOSPITAL_BASED_OUTPATIENT_CLINIC_OR_DEPARTMENT_OTHER): Payer: Medicare PPO

## 2022-01-29 ENCOUNTER — Other Ambulatory Visit: Payer: Self-pay

## 2022-01-29 ENCOUNTER — Encounter (HOSPITAL_COMMUNITY): Payer: Self-pay

## 2022-01-29 ENCOUNTER — Emergency Department (HOSPITAL_COMMUNITY)
Admission: EM | Admit: 2022-01-29 | Discharge: 2022-01-29 | Disposition: A | Discharge status: Home / Self Care | Payer: Medicare PPO | Attending: Emergency Medicine | Admitting: Emergency Medicine

## 2022-01-29 DIAGNOSIS — I340 Nonrheumatic mitral (valve) insufficiency: Secondary | ICD-10-CM

## 2022-01-29 DIAGNOSIS — R079 Chest pain, unspecified: Secondary | ICD-10-CM | POA: Diagnosis not present

## 2022-01-29 DIAGNOSIS — I77810 Thoracic aortic ectasia: Secondary | ICD-10-CM | POA: Diagnosis not present

## 2022-01-29 DIAGNOSIS — I471 Supraventricular tachycardia, unspecified: Secondary | ICD-10-CM

## 2022-01-29 DIAGNOSIS — R0789 Other chest pain: Secondary | ICD-10-CM | POA: Diagnosis not present

## 2022-01-29 LAB — COMPREHENSIVE METABOLIC PANEL
ALT: 22 U/L (ref 0–44)
AST: 36 U/L (ref 15–41)
Albumin: 3.9 g/dL (ref 3.5–5.0)
Alkaline Phosphatase: 98 U/L (ref 38–126)
Anion gap: 13 (ref 5–15)
BUN: 10 mg/dL (ref 8–23)
CO2: 25 mmol/L (ref 22–32)
Calcium: 9.4 mg/dL (ref 8.9–10.3)
Chloride: 99 mmol/L (ref 98–111)
Creatinine, Ser: 0.98 mg/dL (ref 0.61–1.24)
GFR, Estimated: >60 mL/min (ref >60)
Glucose, Bld: 123 mg/dL — ABNORMAL HIGH (ref 70–99)
Potassium: 3.4 mmol/L — ABNORMAL LOW (ref 3.5–5.1)
Sodium: 137 mmol/L (ref 135–145)
Total Bilirubin: 0.9 mg/dL (ref 0.3–1.2)
Total Protein: 6.9 g/dL (ref 6.5–8.1)

## 2022-01-29 LAB — I-STAT CHEM 8, ED
BUN: 14 mg/dL (ref 8–23)
Calcium, Ion: 1.21 mmol/L (ref 1.15–1.40)
Chloride: 100 mmol/L (ref 98–111)
Creatinine, Ser: 1 mg/dL (ref 0.61–1.24)
Glucose, Bld: 125 mg/dL — ABNORMAL HIGH (ref 70–99)
HCT: 48 % (ref 39.0–52.0)
Hemoglobin: 16.3 g/dL (ref 13.0–17.0)
Potassium: 3.5 mmol/L (ref 3.5–5.1)
Sodium: 141 mmol/L (ref 135–145)
TCO2: 26 mmol/L (ref 22–32)

## 2022-01-29 LAB — CBC WITH DIFFERENTIAL/PLATELET
Abs Immature Granulocytes: 0.03 10*3/uL (ref 0.00–0.07)
Basophils Absolute: 0.1 10*3/uL (ref 0.0–0.1)
Basophils Relative: 1 %
Eosinophils Absolute: 0 10*3/uL (ref 0.0–0.5)
Eosinophils Relative: 0 %
HCT: 46.6 % (ref 39.0–52.0)
Hemoglobin: 15.5 g/dL (ref 13.0–17.0)
Immature Granulocytes: 0 %
Lymphocytes Relative: 13 %
Lymphs Abs: 1.2 10*3/uL (ref 0.7–4.0)
MCH: 31.4 pg (ref 26.0–34.0)
MCHC: 33.3 g/dL (ref 30.0–36.0)
MCV: 94.3 fL (ref 80.0–100.0)
Monocytes Absolute: 0.8 10*3/uL (ref 0.1–1.0)
Monocytes Relative: 9 %
Neutro Abs: 7.1 10*3/uL (ref 1.7–7.7)
Neutrophils Relative %: 77 %
Platelets: 224 10*3/uL (ref 150–400)
RBC: 4.94 MIL/uL (ref 4.22–5.81)
RDW: 13 % (ref 11.5–15.5)
WBC: 9.2 10*3/uL (ref 4.0–10.5)
nRBC: 0 % (ref 0.0–0.2)

## 2022-01-29 LAB — TROPONIN I (HIGH SENSITIVITY): Troponin I (High Sensitivity): 8 ng/L (ref <18)

## 2022-01-29 LAB — ECHOCARDIOGRAM COMPLETE
Est EF: 55
S' Lateral: 2.9 cm

## 2022-01-29 LAB — MAGNESIUM: Magnesium: 1.9 mg/dL (ref 1.7–2.4)

## 2022-01-29 MED ORDER — POTASSIUM CHLORIDE CRYS ER 20 MEQ PO TBCR
20.0000 meq | EXTENDED_RELEASE_TABLET | Freq: Once | ORAL | Status: AC
  Administered 2022-01-29: 20 meq via ORAL
  Filled 2022-01-29: qty 1

## 2022-01-29 MED ORDER — ETOMIDATE 2 MG/ML IV SOLN: 0.1000 mg/kg | Freq: Once | INTRAVENOUS | Status: DC

## 2022-01-29 MED ORDER — POTASSIUM CHLORIDE ER 10 MEQ PO TBCR: 10.0000 meq | EXTENDED_RELEASE_TABLET | Freq: Every day | ORAL | 0 refills | Status: DC

## 2022-01-29 MED ORDER — SODIUM CHLORIDE 0.9 % IV BOLUS
1000.0000 mL | Freq: Once | INTRAVENOUS | Status: AC
  Administered 2022-01-29: 1000 mL via INTRAVENOUS

## 2022-01-29 MED ORDER — ATENOLOL 25 MG PO TABS: 25.0000 mg | ORAL_TABLET | Freq: Every day | ORAL | 0 refills | Status: DC

## 2022-01-29 NOTE — Discharge Instructions (Addendum)
Your heart was in an abnormal rhythm called SVT today. This is what was causing you to have chest pain and nausea.   You are being started on a medicine to help with your heart rate called atenolol as well as a low-dose of potassium to help keep your potassium level up.  You will need to follow-up with cardiology and a referral has been placed.  If you develop new or worsening chest pain, shortness of breath, feeling like you are going to pass out, palpitations, or any other new/concerning symptoms then return to the ER for evaluation or call 911.

## 2022-01-29 NOTE — ED Provider Notes (Signed)
[facility] EMERGENCY DEPARTMENT Provider Note   CSN: [id number] Arrival date & time: 01/29/22  5631     History  Chief Complaint  Patient presents with   Chest Pain    [name] is a 82 y.o. male.  HPI 81 year old male presents with chest pain and nausea.  When he woke up this morning he did not feel quite right but could not put a finger on it.  However when he got to the hospital to visit his wife is in the ICU, he noticed he was having dull chest discomfort and nausea.  Denied feeling lightheaded or significantly short of breath.  He denied any palpitations.  He was in the ICU waiting room and did not appear well so he was brought down to the ER.  In triage he was found to have a heart rate of 190 and brought back.  At the time I am seeing him, his heart rate is still 190 but he denies any acute symptoms and states he does feel better.  Home Medications Prior to Admission medications   Medication Sig Start Date End Date Taking? Authorizing Provider  atenolol (TENORMIN) 25 MG tablet Take 1 tablet (25 mg total) by mouth at bedtime. 01/29/22  Yes Sherwood Gambler, MD  potassium chloride (KLOR-CON) 10 MEQ tablet Take 1 tablet (10 mEq total) by mouth daily. 01/29/22  Yes Sherwood Gambler, MD  amLODipine (NORVASC) 5 MG tablet Take 5 mg by mouth daily.      [provider]  atorvastatin (LIPITOR) 20 MG tablet  08/25/17   [provider]  fexofenadine (ALLEGRA) 180 MG tablet Take 180 mg by mouth daily.    [provider]  mupirocin ointment (BACTROBAN) 2 %  08/10/17   [provider]  telmisartan-hydrochlorothiazide (MICARDIS HCT) 80-12.5 MG tablet  09/26/17   [provider]      Allergies    Pollen extract    Review of Systems   Review of Systems  Respiratory:  Negative for shortness of breath.   Cardiovascular:  Positive for chest pain. Negative for palpitations.  Gastrointestinal:  Positive for nausea.    Physical  Exam Updated Vital Signs BP 106/85   Pulse 87   Temp 98.6 F (37 C)   Resp 19   SpO2 96%  Physical Exam Vitals and nursing note reviewed.  Constitutional:      Appearance: He is well-developed. He is not diaphoretic.  HENT:     Head: Normocephalic and atraumatic.  Cardiovascular:     Rate and Rhythm: Regular rhythm. Tachycardia present.     Heart sounds: Normal heart sounds.  Pulmonary:     Effort: Pulmonary effort is normal.     Breath sounds: Normal breath sounds.  Abdominal:     Palpations: Abdomen is soft.     Tenderness: There is no abdominal tenderness.  Musculoskeletal:     Right lower leg: No edema.     Left lower leg: No edema.  Skin:    General: Skin is warm and dry.  Neurological:     Mental Status: He is alert.     ED Results / Procedures / Treatments   Labs (all labs ordered are listed, but only abnormal results are displayed) Labs Reviewed  COMPREHENSIVE METABOLIC PANEL - Abnormal; Notable for the following components:      Result Value   Potassium 3.4 (*)    Glucose, Bld 123 (*)    All other components within normal limits  I-STAT  CHEM 8, ED - Abnormal; Notable for the following components:   Glucose, Bld 125 (*)    All other components within normal limits  CBC WITH DIFFERENTIAL/PLATELET  MAGNESIUM  TROPONIN I (HIGH SENSITIVITY)    EKG EKG Interpretation  Date/Time:  Friday January 29 2022 09:23:23 EST Ventricular Rate:  191 PR Interval:  112 QRS Duration: 112 QT Interval:  284 QTC Calculation: 507 R Axis:   -84 Text Interpretation: Supraventricular tachycardia Left anterior fascicular block Abnormal R-wave progression, late transition Repolarization abnormality, prob rate related Baseline wander in lead(s) V6 Confirmed by Sherwood Gambler [phone]) on 01/29/2022 9:49:26 AM  Radiology ECHOCARDIOGRAM COMPLETE  Result Date: 01/29/2022    ECHOCARDIOGRAM REPORT   Patient Name:   [facility] Date of Exam: 01/29/2022 Medical Rec #:  [id number]     Height:       69.5 in Accession #:    [id number]   Weight:       205.2 lb Date of Birth:  Dec 24, 1940    BSA:          2.099 m Patient Age:    61 years     BP:           110/80 mmHg Patient Gender: M            HR:           98 bpm. Exam Location:  Inpatient Procedure: 2D Echo Indications:    SVT  History:        Patient has no prior history of Echocardiogram examinations.                 Arrythmias:Tachycardia.  Sonographer:    Darlina Sicilian RDCS Referring Phys: [place]  1. Abnormal septal motion . Left ventricular ejection fraction, by estimation, is 55%. The left ventricle has normal function. The left ventricle has no regional wall motion abnormalities. There is moderate left ventricular hypertrophy. Left ventricular  diastolic parameters are consistent with Grade I diastolic dysfunction (impaired relaxation).  2. Right ventricular systolic function is normal. The right ventricular size is normal.  3. The mitral valve is abnormal. Trivial mitral valve regurgitation. No evidence of mitral stenosis. Moderate mitral annular calcification.  4. The aortic valve is tricuspid. There is moderate calcification of the aortic valve. There is moderate thickening of the aortic valve. Aortic valve regurgitation is not visualized. Aortic valve sclerosis/calcification is present, without any evidence of aortic stenosis.  5. Aortic dilatation noted. There is moderate dilatation of the aortic root, measuring 42 mm. There is mild dilatation of the ascending aorta, measuring 40 mm.  6. The inferior vena cava is normal in size with greater than 50% respiratory variability, suggesting right atrial pressure of 3 mmHg. FINDINGS  Left Ventricle: Abnormal septal motion. Left ventricular ejection fraction, by estimation, is 55%. The left ventricle has normal function. The left ventricle has no regional wall motion abnormalities. The left ventricular internal cavity size was normal  in size. There is moderate left  ventricular hypertrophy. Left ventricular diastolic parameters are consistent with Grade I diastolic dysfunction (impaired relaxation). Right Ventricle: The right ventricular size is normal. No increase in right ventricular wall thickness. Right ventricular systolic function is normal. Left Atrium: Left atrial size was normal in size. Right Atrium: Right atrial size was normal in size. Pericardium: There is no evidence of pericardial effusion. Mitral Valve: The mitral valve is abnormal. There is mild thickening of the mitral valve leaflet(s). There is mild calcification of  the mitral valve leaflet(s). Moderate mitral annular calcification. Trivial mitral valve regurgitation. No evidence of mitral valve stenosis. Tricuspid Valve: The tricuspid valve is normal in structure. Tricuspid valve regurgitation is trivial. No evidence of tricuspid stenosis. Aortic Valve: The aortic valve is tricuspid. There is moderate calcification of the aortic valve. There is moderate thickening of the aortic valve. Aortic valve regurgitation is not visualized. Aortic valve sclerosis/calcification is present, without any  evidence of aortic stenosis. Pulmonic Valve: The pulmonic valve was normal in structure. Pulmonic valve regurgitation is not visualized. No evidence of pulmonic stenosis. Aorta: Aortic dilatation noted. There is moderate dilatation of the aortic root, measuring 42 mm. There is mild dilatation of the ascending aorta, measuring 40 mm. Venous: The inferior vena cava is normal in size with greater than 50% respiratory variability, suggesting right atrial pressure of 3 mmHg. IAS/Shunts: No atrial level shunt detected by color flow Doppler.  LEFT VENTRICLE PLAX 2D LVIDd:         4.20 cm LVIDs:         2.90 cm LV PW:         1.10 cm LV IVS:        1.40 cm LVOT diam:     2.00 cm LV SV:         49 LV SV Index:   23 LVOT Area:     3.14 cm  RIGHT VENTRICLE RV S prime:     11.50 cm/s TAPSE (M-mode): 1.9 cm LEFT ATRIUM              Index        RIGHT ATRIUM           Index LA diam:        3.40 cm 1.62 cm/m   RA Area:     11.60 cm LA Vol (A2C):   43.5 ml 20.72 ml/m  RA Volume:   23.50 ml  11.20 ml/m LA Vol (A4C):   45.6 ml 21.72 ml/m LA Biplane Vol: 45.3 ml 21.58 ml/m  AORTIC VALVE LVOT Vmax:   97.90 cm/s LVOT Vmean:  67.200 cm/s LVOT VTI:    0.157 m  AORTA Ao Root diam: 4.20 cm Ao Asc diam:  3.85 cm  SHUNTS Systemic VTI:  0.16 m Systemic Diam: 2.00 cm Jenkins Rouge MD Electronically signed by Jenkins Rouge MD Signature Date/Time: 01/29/2022/12:03:25 PM    Final    DG Chest Portable 1 View  Result Date: 01/29/2022 CLINICAL DATA:  chest pain EXAM: PORTABLE CHEST - 1 VIEW COMPARISON:  None Available. FINDINGS: Cardiac silhouette is unremarkable. No pneumothorax or pleural effusion. The lungs are clear. Aorta is calcified ectatic and tortuous. There are thoracic degenerative changes. IMPRESSION: No acute cardiopulmonary process. Electronically Signed   By: Sammie Bench M.D.   On: 01/29/2022 09:47    Procedures Procedures    Medications Ordered in ED Medications  sodium chloride 0.9 % bolus 1,000 mL (0 mLs Intravenous Stopped 01/29/22 1000)  potassium chloride SA (KLOR-CON M) CR tablet 20 mEq (20 mEq Oral Given 01/29/22 1221)    ED Course/ Medical Decision Making/ A&P Clinical Course as of 01/29/22 1505  Fri Jan 29, 2022  0932 REVERT maneuver did not break his arrythmia.  Given blood pressures in the 80s-90s, I was prepared to give etomidate and cardiovert.  Likely SVT with aberrancy but could be ventricular tachycardia given the rate.  However right as these orders were placed, he spontaneously converted to a sinus rhythm.  His blood  pressure has come up.  Will evaluate with some labs and observe and then discussed with cardiology. [SG]    Clinical Course User Index [SG] Sherwood Gambler, MD                           Medical Decision Making Amount and/or Complexity of Data Reviewed Labs: ordered.    Details:  Potassium 3.4 but otherwise electrolytes unremarkable and troponin normal. Radiology: ordered and independent interpretation performed.    Details: No acute CHF ECG/medicine tests: independent interpretation performed.    Details: EKG with SVT.  Second EKG shows no acute ischemia.  Risk Prescription drug management.   Patient presents with likely SVT.  However he is mildly hypotensive so as above we had plan to cardiovert but then he spontaneously converted.  Initial troponin is negative.  He is currently asymptomatic.  I discussed case with Dr. Johnsie Cancel, seen patient.  He has ordered a stat echo which she has read as overall unremarkable in regards to structural heart disease and from his perspective can be discharged.  He advises to not check a second troponin as this is likely all arrhythmia related.  He advises him to go on atenolol 25 mg at night and 10 mg of potassium during the day.  Will arrange for cardiology follow-up.  Otherwise the patient has remained asymptomatic, vital signs are now normal, and he appears stable for discharge home.  Will give return precautions.        Final Clinical Impression(s) / ED Diagnoses Final diagnoses:  SVT (supraventricular tachycardia)    Rx / DC Orders ED Discharge Orders          Ordered    potassium chloride (KLOR-CON) 10 MEQ tablet  Daily        01/29/22 1208    atenolol (TENORMIN) 25 MG tablet  Daily at bedtime        01/29/22 1208    Ambulatory referral to Cardiology       Comments: If you have not heard from the Cardiology office within the next 72 hours please call [phone].   01/29/22 7793              Sherwood Gambler, MD 01/29/22 [phone]

## 2022-01-29 NOTE — ED Triage Notes (Addendum)
Pt arrived from upstairs where he is visiting his wife on 4N after a stroke. Pt woke up feeling not the best the morning but still came in to see his wife. Staff noticed he did not look that great and was c/o centralized CP. Pt states he has some nausea and maybe some SHOB. Pt's HR is in the 190s and BP is in the 80s.

## 2022-01-29 NOTE — Consult Note (Signed)
CARDIOLOGY CONSULT NOTE       Patient ID: [name] MRN: [id number] DOB/AGE: 81-May-1942 81 y.o.  Admit date: 01/29/2022 Referring Physician: Regenia Skeeter Primary Physician: Vernie Shanks, MD (Inactive) Primary Cardiologist: New Reason for Consultation: SVT  Active Problems:   SVT (supraventricular tachycardia)   HPI:  82 y.o. 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 Unfortunately his wife of 69 years had a bad stroke a few days ago and is hospitalized at [facility] He was at elevators going to room and felt palpitations and nausea. In ED was in SVT at rate of 194 bpm.  ECG axis and morphology same as sinus . Currently feels back to normal with SR rate 98 bpm.  Troponin negative labs otherwise ok. CXR NAD He has been seeing primary and neurology for cognitive impairment has appointment with [facility] in a couple weeks Not sure why appointment made No history of CHF, arrhythmia, vascular disease or CAD. 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   ROS All other systems reviewed and negative except as noted above  Past Medical History:  Diagnosis Date   Essential hypertension, benign    Essential tremor    hands bilat more in right    H/O urinary frequency    with use of furosemide as stated per pt    Headache    hx of with ICH 2011   Hemorrhoids    Hyperlipidemia    Intracranial hemorrhage ([place]) 1/12   Acute right occipital hemorrhage with small right parietal subdural hematoma   Lower leg edema    bilat comes and goes has been prescribed furosemide to aide in relieve pt has currently not been using due to causing incontinence as stated per pt    MCI (mild cognitive impairment) 09/11/2020   Obstructive sleep apnea    not using CPAP machine    Osteoarthritis    Prostate cancer ([place])    Radiation 09/06/13-11/06/13   prostate 82 gray   Tremor, essential 09/11/2020    Urinary incontinence    with use of furosemide as stated per pt    Vitamin B12 deficiency 09/15/2020    Family History  Problem Relation Age of Onset   Hypertension Other    Cancer Paternal Uncle     Social History   Socioeconomic History   Marital status: Married    Spouse name: Opal Sidles   Number of children: 2   Years of education: Not on file   Highest education level: Master's degree (e.g., MA, MS, MEng, MEd, MSW, MBA)  Occupational History   Occupation: Retired    Comment: Recruitment consultant: RETIRED  Tobacco Use   Smoking status: Former    Packs/day: 0.50    Years: 25.00    Total pack years: 12.50    Types: Cigarettes    Quit date: 03/14/2005    Years since quitting: 16.8   Smokeless tobacco: Never  Substance and Sexual Activity   Alcohol use: Yes    Comment: occas beer - more in summer-6pk per wk   Drug use: No   Sexual activity: Not on file  Other Topics Concern   Not on file  Social History Narrative   09/11/20 Lives with wife   Social Determinants of Health   Financial Resource Strain: Not on file  Food Insecurity: Not on file  Transportation Needs: Not on file  Physical Activity: Not  on file  Stress: Not on file  Social Connections: Not on file  Intimate Partner Violence: Not on file    Past Surgical History:  Procedure Laterality Date   Arthroscopic right knee surgery     menicus tear     right knee 30 years ago was surgically repaired   TOTAL KNEE ARTHROPLASTY Right 10/21/2014   Procedure: TOTAL RIGHT KNEE ARTHROPLASTY;  Surgeon: Paralee Cancel, MD;  Location: [facility];  Service: Orthopedics;  Laterality: Right;      Current Facility-Administered Medications:    potassium chloride SA (KLOR-CON M) CR tablet 20 mEq, 20 mEq, Oral, Once, Sherwood Gambler, MD  Current Outpatient Medications:    amLODipine (NORVASC) 5 MG tablet, Take 5 mg by mouth daily.  , Disp: , Rfl:    atorvastatin (LIPITOR) 20 MG tablet, , Disp: , Rfl:    fexofenadine (ALLEGRA) 180  MG tablet, Take 180 mg by mouth daily., Disp: , Rfl:    mupirocin ointment (BACTROBAN) 2 %, , Disp: , Rfl:    telmisartan-hydrochlorothiazide (MICARDIS HCT) 80-12.5 MG tablet, , Disp: , Rfl:   potassium chloride SA  20 mEq Oral Once     Physical Exam: Blood pressure 106/64, pulse 99, temperature 98.6 F (37 C), resp. rate 13, SpO2 93 %.    Elderly male with poor dentition Essential tremor in UE;s  Lungs clear No murmur Abdomen benign Palpable pedal pulses  No edema   Labs:   Lab Results  Component Value Date   WBC 9.2 01/29/2022   HGB 15.5 01/29/2022   HCT 46.6 01/29/2022   MCV 94.3 01/29/2022   PLT 224 01/29/2022    Recent Labs  Lab 01/29/22 0950  NA 137  K 3.4*  CL 99  CO2 25  BUN 10  CREATININE 0.98  CALCIUM 9.4  PROT 6.9  BILITOT 0.9  ALKPHOS 98  ALT 22  AST 36  GLUCOSE 123*   No results found for: "CKTOTAL", "CKMB", "CKMBINDEX", "TROPONINI"  Lab Results  Component Value Date   CHOL  01/29/2010    195        ATP III CLASSIFICATION:  <200     mg/dL   Desirable  200-239  mg/dL   Borderline High  >=240    mg/dL   High          Lab Results  Component Value Date   HDL 40 01/29/2010   Lab Results  Component Value Date   LDLCALC (H) 01/29/2010    138        Total Cholesterol/HDL:CHD Risk Coronary Heart Disease Risk Table                     Men   Women  1/2 Average Risk   3.4   3.3  Average Risk       5.0   4.4  2 X Average Risk   9.6   7.1  3 X Average Risk  23.4   11.0        Use the calculated Patient Ratio above and the CHD Risk Table to determine the patient's CHD Risk.        ATP III CLASSIFICATION (LDL):  <100     mg/dL   Optimal  100-129  mg/dL   Near or Above                    Optimal  130-159  mg/dL   Borderline  160-189  mg/dL   High  >  190     mg/dL   Very High   Lab Results  Component Value Date   TRIG 84 01/29/2010   Lab Results  Component Value Date   CHOLHDL 4.9 01/29/2010   No results found for: "LDLDIRECT"     Radiology: DG Chest Portable 1 View  Result Date: 01/29/2022 CLINICAL DATA:  chest pain EXAM: PORTABLE CHEST - 1 VIEW COMPARISON:  None Available. FINDINGS: Cardiac silhouette is unremarkable. No pneumothorax or pleural effusion. The lungs are clear. Aorta is calcified ectatic and tortuous. There are thoracic degenerative changes. IMPRESSION: No acute cardiopulmonary process. Electronically Signed   By: Sammie Bench M.D.   On: 01/29/2022 09:47    EKG: SVT rate 194 no pre excitation   ASSESSMENT AND PLAN:   SVT:  does not appear to be flutter / fib No pre excitation Broke on its own and patient now asymptomatic. Labs and troponin normal Will get TTE prior to d/c to r/o structural heart dx.  Will arrange 30 day monitor and he can f/u with [facility] as already scheduled Add Atenolol 25 mg qhs to start and can take in am after a week if tolerates  Cognition:  f/u with neurology can consider [place] HTN:  Continue ARB well controlled  HLD:  continue statin   Signed: Jenkins Rouge 01/29/2022, 11:16 AM

## 2022-02-02 ENCOUNTER — Telehealth: Payer: Self-pay

## 2022-02-02 ENCOUNTER — Telehealth: Payer: Self-pay | Admitting: Internal Medicine

## 2022-02-02 NOTE — Telephone Encounter (Signed)
I called the pt to move his ot with Dr Harrington Challenger being out to another provider but the pt declined and says that he needed to cancel for now since his wife was recently admitted to the [facility].. he says he has been staying all day with her and cannot come to an appt at this time... he will call back and reschedule his new pt apt for SVT... he says he is feeling well and will call if he develops any symptoms sooner if needed.

## 2022-02-02 NOTE — Telephone Encounter (Signed)
See other phone note from today.  I spoke with patient's son in law.  He reports he spoke with Dr Harrington Challenger earlier today.  Also received a call from the office which he thinks was from [place] but he was not able to answer call.  Chart reviewed and Dr Harrington Challenger recommends setting up with 4 wk holter monitor when son gets back Thursday and office visit with Dr Harrington Challenger soon  I gave son in law this information.  I told him monitor could be mailed or applied in office. Son in law will be back in [place] on Thursday and could bring patient in to have monitor applied on Friday. Will have Ann contact son in law when she is back in the office tomorrow to make arrangements for monitor and schedule appointment with Dr Harrington Challenger.

## 2022-02-02 NOTE — Telephone Encounter (Signed)
Spoke with patinet's son who called in to provide information   Patient not a good historian Per son he had an appt with me Chosen had brain hemorrhage in 2012 Hx of OSA and HTN and hearing loss, poor dentition  4 cracked teeth REcently diagnosed with dementia   Sees Dr Bland Span Has dull chest pain, tiredness and room will spin a little.  DId not sense racing  10 days ago in [facility] ED had SVT 190 bpm     Pt did not articulate that   [facility] he felt weak and dizzy     Compliance with meds is poor   even with daily med dispenser  Patient now is living by himself   Son has been visiting some    Pt and wife reported that it has been going on for year   Has had about 5 spells   Son is getting back into town on Thursday      Recomm:   1.  I would recomm setting up with 4 wk holter monitor when son gets back Thursday     2   Try to get into clinic with me soon

## 2022-02-02 NOTE — Telephone Encounter (Signed)
I spoke with the pts son in law and as did Dr Harrington Challenger and plan is for 4 week monitor as well as an appt... I made him an appt for this Friday per his family's request and will put the monitor on after he sees Dr Harrington Challenger.

## 2022-02-02 NOTE — Telephone Encounter (Signed)
Pt's son-in-law is calling back to see if he can change current appt scheduled to a morning appt tomorrow. He states he was on the phone with Lelon Frohlich, RN, but was disconnected and is requesting to speak to her again.

## 2022-02-03 ENCOUNTER — Ambulatory Visit: Payer: Medicare PPO | Admitting: Internal Medicine

## 2022-02-04 ENCOUNTER — Telehealth: Payer: Self-pay | Admitting: Internal Medicine

## 2022-02-04 DIAGNOSIS — R002 Palpitations: Secondary | ICD-10-CM

## 2022-02-04 DIAGNOSIS — I471 Supraventricular tachycardia, unspecified: Secondary | ICD-10-CM

## 2022-02-04 NOTE — Telephone Encounter (Signed)
Spoke with the patient who states that his wife is in the hospital so he does not feel like he had time to come in for an appointment at this time. He states that he will call back when she gets better to reschedule.

## 2022-02-04 NOTE — Telephone Encounter (Signed)
Patient can't make his appt tomorrow b/c of stomach issues. I gave the next available day which is 2/16. they are not happy with that being that his original appt was cancel earlier this week because of provider. Patient would like for Ann to contact him.

## 2022-02-05 ENCOUNTER — Ambulatory Visit: Payer: Medicare PPO | Admitting: Internal Medicine

## 2022-02-05 NOTE — Telephone Encounter (Signed)
I spoke with the pt and he verbally gave me permission for our office to talk with his Son In law Dr Albina Billet going forward about his care.

## 2022-02-05 NOTE — Telephone Encounter (Signed)
See other encounter RE: this message.

## 2022-02-05 NOTE — Addendum Note (Signed)
Addended by: Stephani Police on: 02/05/2022 12:13 PM   Modules accepted: Orders

## 2022-02-05 NOTE — Telephone Encounter (Addendum)
Spoke with the pts son in Sports coach... and he also says that the pt has developed a GI illness... he is asking to come in this Monday to come in and pick up the event monitor to take home to put on him... he declined [employer] since they are not home often due to the pts wife being in the hosp... he is traveling to be with the pt and will be local as of later this afternoon.   Son In [place]... Dr Albina Billet [phone]

## 2022-02-08 ENCOUNTER — Telehealth: Payer: Self-pay | Admitting: Internal Medicine

## 2022-02-08 NOTE — Telephone Encounter (Signed)
Pts daughter advised that it is okay to pick up the monitor 02/09/22.

## 2022-02-08 NOTE — Telephone Encounter (Signed)
Pt's daughter calling to make nurses aware that they will be by office tomorrow to pick up monitor. Please advise

## 2022-02-13 DIAGNOSIS — I471 Supraventricular tachycardia, unspecified: Secondary | ICD-10-CM | POA: Diagnosis not present

## 2022-02-13 DIAGNOSIS — R002 Palpitations: Secondary | ICD-10-CM

## 2022-02-14 DIAGNOSIS — R002 Palpitations: Secondary | ICD-10-CM | POA: Diagnosis not present

## 2022-02-14 DIAGNOSIS — I471 Supraventricular tachycardia, unspecified: Secondary | ICD-10-CM | POA: Diagnosis not present

## 2022-02-15 ENCOUNTER — Telehealth: Payer: Self-pay | Admitting: Internal Medicine

## 2022-02-15 MED ORDER — ATENOLOL 25 MG PO TABS: 25.0000 mg | ORAL_TABLET | Freq: Every day | ORAL | 0 refills | Status: DC

## 2022-02-15 NOTE — Telephone Encounter (Signed)
High Priority as patient only has 2 tablets left.

## 2022-02-15 NOTE — Telephone Encounter (Signed)
Refill sent with note to please reschedule appt with Dr Harrington Challenger.

## 2022-02-15 NOTE — Telephone Encounter (Signed)
Confirmed a recording was transmitted 02/13/22 at 6:30PM.  Explained monitor would record automatically if patient had a sustained event of an arrhythmia on physicians notification list. He can also call [employer] directly and they would be able to confirm the monitor is transmitting.  We only see posted recordings.

## 2022-02-15 NOTE — Telephone Encounter (Signed)
*  STAT* If patient is at the pharmacy, call can be transferred to refill team.   1. Which medications need to be refilled? (please list name of each medication and dose if known)   atenolol (TENORMIN) 25 MG tablet   2. Which pharmacy/location (including street and city if local pharmacy) is medication to be sent to?  [facility] [id number] - [place], [place] [address]   3. Do they need a 30 day or 90 day supply? 90 day  Son-in-law stated the patient only has 2 tablets left.

## 2022-02-15 NOTE — Telephone Encounter (Signed)
Son-in-law stated he is following-up to check patient's Holter readings are being received.  Son-in-law also requested refill for patient's atenolol (TENORMIN) 25 MG tablet.

## 2022-02-16 ENCOUNTER — Encounter: Payer: Self-pay | Admitting: Internal Medicine

## 2022-02-16 DIAGNOSIS — C61 Malignant neoplasm of prostate: Secondary | ICD-10-CM | POA: Diagnosis not present

## 2022-02-16 DIAGNOSIS — Z8546 Personal history of malignant neoplasm of prostate: Secondary | ICD-10-CM | POA: Diagnosis not present

## 2022-02-16 NOTE — Telephone Encounter (Signed)
Error

## 2022-02-17 DIAGNOSIS — G309 Alzheimer's disease, unspecified: Secondary | ICD-10-CM | POA: Diagnosis not present

## 2022-02-17 DIAGNOSIS — F0280 Dementia in other diseases classified elsewhere without behavioral disturbance: Secondary | ICD-10-CM | POA: Diagnosis not present

## 2022-02-21 ENCOUNTER — Ambulatory Visit: Payer: Medicare PPO | Attending: Internal Medicine

## 2022-02-21 DIAGNOSIS — I471 Supraventricular tachycardia, unspecified: Secondary | ICD-10-CM

## 2022-02-21 DIAGNOSIS — R002 Palpitations: Secondary | ICD-10-CM

## 2022-03-01 NOTE — Progress Notes (Unsigned)
Monitor from office inventory given to patient.  Dr. Harrington Challenger to read.

## 2022-03-02 ENCOUNTER — Telehealth: Payer: Self-pay | Admitting: Internal Medicine

## 2022-03-02 NOTE — Telephone Encounter (Signed)
Patient brought in his his heart monitor today because he has never used it.  Patient has not seen a cardiologist here and does not need the monitor.

## 2022-03-02 NOTE — Telephone Encounter (Signed)
Patient son-in-law Wynelle Link called stated his father-in-law has dementia. The patient received an phone call stating device is not  working correctly. Shanon Brow asked if the patient can stop by the clinic and have someone look at it. The son-in-law stated he is the patient P.O.A. and will upload the document in the patient [name]. Will forward to the device clinic.

## 2022-03-02 NOTE — Telephone Encounter (Signed)
Caller would like a call back from [place] regarding patient needing assistance adjusting his monitor.  Caller stated patient has dementia and he is out of town.

## 2022-03-02 NOTE — Telephone Encounter (Signed)
Device clinic does nothing with event monitors.

## 2022-03-03 NOTE — Telephone Encounter (Signed)
FYI--Patient's daughter is following up. She states they were able to speak with Shelly to explain that the patient had a brief memory lapse due to dementia. Patient planned to come by the office today with charger to get his monitor registered again with [facility]. She states he arrived at the office today and was unable to locate the charger so he went home. Patient's daughter states they are normally able to assist with the patient, but they are out of town right now. She apologizes and states that the patient plans to go by the office tomorrow to try again.

## 2022-03-04 NOTE — Telephone Encounter (Signed)
Left a message for the pts daughter to follow up with her and to at this point make an appt for the pt to be established with [place]. To at least get one scheduled.

## 2022-03-16 DIAGNOSIS — F0390 Unspecified dementia without behavioral disturbance: Secondary | ICD-10-CM | POA: Diagnosis not present

## 2022-03-31 ENCOUNTER — Ambulatory Visit: Payer: Medicare PPO | Admitting: Internal Medicine

## 2022-04-12 ENCOUNTER — Ambulatory Visit: Payer: HMO | Attending: Internal Medicine | Admitting: Internal Medicine

## 2022-04-12 ENCOUNTER — Encounter: Payer: Self-pay | Admitting: Internal Medicine

## 2022-04-12 DIAGNOSIS — E782 Mixed hyperlipidemia: Secondary | ICD-10-CM

## 2022-04-12 DIAGNOSIS — I471 Supraventricular tachycardia, unspecified: Secondary | ICD-10-CM

## 2022-04-12 DIAGNOSIS — R002 Palpitations: Secondary | ICD-10-CM

## 2022-04-12 DIAGNOSIS — I739 Peripheral vascular disease, unspecified: Secondary | ICD-10-CM | POA: Diagnosis not present

## 2022-04-12 DIAGNOSIS — I10 Essential (primary) hypertension: Secondary | ICD-10-CM

## 2022-04-12 MED ORDER — AMLODIPINE BESYLATE 5 MG PO TABS: 5.0000 mg | ORAL_TABLET | Freq: Every day | ORAL | 3 refills | Status: DC

## 2022-04-12 NOTE — Patient Instructions (Addendum)
Medication Instructions:  Amlodipine 5 mg daily   *If you need a refill on your cardiac medications before your next appointment, please call your pharmacy*   Lab Work: Nmr, bmet, hgba1c  If you have labs (blood work) drawn today and your tests are completely normal, you will receive your results only by: [place] (if you have MyChart) OR A paper copy in the mail If you have any lab test that is abnormal or we need to change your treatment, we will call you to review the results.   Testing/Procedures:  How to Prepare for Your Cardiac PET/CT Stress Test:  1. Please do not take these medications before your test:   Medications that may interfere with the cardiac pharmacological stress agent (ex. nitrates - including erectile dysfunction medications, isosorbide mononitrate, tamulosin or beta-blockers) the day of the exam. (Erectile dysfunction medication should be held for at least 72 hrs prior to test) Your remaining medications may be taken with water.  2. Nothing to eat or drink, except water, 3 hours prior to arrival time.   NO caffeine/decaffeinated products, or chocolate 12 hours prior to arrival.  3. NO perfume, cologne or lotion  4. Total time is 1 to 2 hours; you may want to bring reading material for the waiting time.  5. Please report to Radiology at the [facility] Main Entrance 30 minutes early for your test.  [place], [place] [address]   In preparation for your appointment, medication and supplies will be purchased.  Appointment availability is limited, so if you need to cancel or reschedule, please call the Radiology Department at [phone]  24 hours in advance to avoid a cancellation fee of $100.00  What to Expect After you Arrive:  Once you arrive and check in for your appointment, you will be taken to a preparation room within the Radiology Department.  A technologist or Nurse will obtain your medical history, verify that you  are correctly prepped for the exam, and explain the procedure.  Afterwards,  an IV will be started in your arm and electrodes will be placed on your skin for EKG monitoring during the stress portion of the exam. Then you will be escorted to the PET/CT scanner.  There, staff will get you positioned on the scanner and obtain a blood pressure and EKG.  During the exam, you will continue to be connected to the EKG and blood pressure machines.  A small, safe amount of a radioactive tracer will be injected in your IV to obtain a series of pictures of your heart along with an injection of a stress agent.    After your Exam:  It is recommended that you eat a meal and drink a caffeinated beverage to counter act any effects of the stress agent.  Drink plenty of fluids for the remainder of the day and urinate frequently for the first couple of hours after the exam.  Your doctor will inform you of your test results within 7-10 business days.  For questions about your test or how to prepare for your test, please call: Marchia Bond, Cardiac Imaging Nurse Navigator  Gordy Clement, Cardiac Imaging Nurse Navigator Office: [phone]    Follow-Up: At [facility], you and your health needs are our priority.  As part of our continuing mission to provide you with exceptional heart care, we have created designated Provider Care Teams.  These Care Teams include your primary Cardiologist (physician) and Advanced Practice Providers (APPs -  Physician Assistants and Nurse Practitioners) who all work together to provide you with the care you need, when you need it.  We recommend signing up for the patient portal called "MyChart".  Sign up information is provided on this After Visit Summary.  MyChart is used to connect with patients for Virtual Visits (Telemedicine).  Patients are able to view lab/test results, encounter notes, upcoming appointments, etc.  Non-urgent messages can be sent to your provider as well.    To learn more about what you can do with MyChart, go to [email or web address].

## 2022-04-12 NOTE — Progress Notes (Signed)
Cardiology Office Note   Date:  04/12/2022   ID:  [name], DOB [birth year], MRN [id number]  PCP:  Craig Shanks, MD (Inactive)  Cardiologist:   Craig Carnes, MD   Patient presents for evaluation of SVT     History of Present Illness: [name] is a 82 y.o. male with a history of HTN, HL, ICH, prostattCA (s/p XRT)    He was seen by cardiology in Jan 2024 when he went to the ER   That day he felt nausea and palpitations   Went to ER   Found to be in SVT at 194 bpm    Went back to SR on own. Trop negative   Pt placed on atenolol 25 mg and he was set up for a 30 day monitor  This showed no arrhythmias    Echo done  Moderate LVH  LVEF 55%  Aorta 42 mm at root  The pt's son in law is on phone during visit   he said that the patient has had 4 or 5 episodes in the past when he felt nauseated, felt bad   Went to bed   When woke up would feel better     Never went to ER  Since ER visit again, no nausea.    He is under increased stress Wife of 42 years is not well, in coma  Visits her in facility  May have more fatigue          Current Meds  Medication Sig   amLODipine (NORVASC) 5 MG tablet Take 5 mg by mouth daily.     atenolol (TENORMIN) 25 MG tablet Take 1 tablet (25 mg total) by mouth at bedtime. Please reschedule appt to see Dr Harrington Challenger in the office.   atorvastatin (LIPITOR) 20 MG tablet    telmisartan-hydrochlorothiazide (MICARDIS HCT) 80-12.5 MG tablet      Allergies:   Pollen extract   Past Medical History:  Diagnosis Date   Essential hypertension, benign    Essential tremor    hands bilat more in right    H/O urinary frequency    with use of furosemide as stated per pt    Headache    hx of with [place] 2011   Hemorrhoids    Hyperlipidemia    Intracranial hemorrhage ([place]) 1/12   Acute right occipital hemorrhage with small right parietal subdural hematoma   Lower leg edema    bilat comes and goes has been prescribed furosemide to aide in relieve pt has currently not  been using due to causing incontinence as stated per pt    MCI (mild cognitive impairment) 09/11/2020   Obstructive sleep apnea    not using CPAP machine    Osteoarthritis    Prostate cancer ([place])    Radiation 09/06/13-11/06/13   prostate 78 gray   Tremor, essential 09/11/2020   Urinary incontinence    with use of furosemide as stated per pt    Vitamin B12 deficiency 09/15/2020    Past Surgical History:  Procedure Laterality Date   Arthroscopic right knee surgery     menicus tear     right knee 30 years ago was surgically repaired   TOTAL KNEE ARTHROPLASTY Right 10/21/2014   Procedure: TOTAL RIGHT KNEE ARTHROPLASTY;  Surgeon: Paralee Cancel, MD;  Location: [facility] ORS;  Service: Orthopedics;  Laterality: Right;     Social History:  The patient  reports that he quit smoking about 17 years ago. His smoking use included cigarettes. He  has a 12.50 pack-year smoking history. He has never used smokeless tobacco. He reports current alcohol use. He reports that he does not use drugs.   Family History:  The patient's family history includes Cancer in his paternal uncle; Hypertension in an other family member.    ROS:  Please see the history of present illness. All other systems are reviewed and  Negative to the above problem except as noted.    PHYSICAL EXAM: VS:  BP (!) 150/77   Pulse 74   Ht 5' 9.5" (1.765 m)   Wt 188 lb 6.4 oz (85.5 kg)   SpO2 94%   BMI 27.42 kg/m   GEN: Well nourished, well developed, in no acute distress  HEENT: normal  Neck: no JVD, carotid bruit Cardiac: RRR; no murmurs,  No LE edema  Respiratory:  clear to auscultation bilaterally,  GI: soft   Obese   No hepatomegaly   EKG:  EKG is not ordered today.   Lipid Panel    Component Value Date/Time   CHOL  01/29/2010 0336    195        ATP III CLASSIFICATION:  <200     mg/dL   Desirable  200-239  mg/dL   Borderline High  >=240    mg/dL   High          TRIG 84 01/29/2010 0336   HDL 40 01/29/2010 0336    CHOLHDL 4.9 01/29/2010 0336   VLDL 17 01/29/2010 0336   LDLCALC (H) 01/29/2010 0336    138        Total Cholesterol/HDL:CHD Risk Coronary Heart Disease Risk Table                     Men   Women  1/2 Average Risk   3.4   3.3  Average Risk       5.0   4.4  2 X Average Risk   9.6   7.1  3 X Average Risk  23.4   11.0        Use the calculated Patient Ratio above and the CHD Risk Table to determine the patient's CHD Risk.        ATP III CLASSIFICATION (LDL):  <100     mg/dL   Optimal  100-129  mg/dL   Near or Above                    Optimal  130-159  mg/dL   Borderline  160-189  mg/dL   High  >190     mg/dL   Very High      Wt Readings from Last 3 Encounters:  04/12/22 188 lb 6.4 oz (85.5 kg)  09/11/20 205 lb 3.2 oz (93.1 kg)  03/24/18 212 lb (96.2 kg)      ASSESSMENT AND PLAN:  1  SVT   One episode of documented SVT   Broke on own   Currently tolerating atenolol   Would continue  Recomm a Kardia mobile device if has symptoms in future    2  Nausea/fatigue   The pt had nausea when HR was fast    He had spells in past of nausea / fatigue  ? In SVT at time   ? Something else   ? Anginal equivalent      Pt is not the best historian   I have reviewed with pt and son in law   Would recomm a Lexiscan  PET / CT  to r/o inducible ischemia       3  HTN   BP is high   Will increase amlodipine to 10 mg daily     4  Lipids  Pt on lipitor 20   LDL 128   Will review after PET/CT done    Current medicines are reviewed at length with the patient today.  The patient does not have concerns regarding medicines.  Signed, Craig Carnes, MD  04/12/2022 3:34 PM    [address], [place], [place]  [address] Phone: [phone]; Fax: [phone]

## 2022-04-13 LAB — NMR, LIPOPROFILE
Cholesterol, Total: 165 mg/dL (ref 100–199)
HDL Particle Number: 33.2 umol/L (ref >=30.5)
HDL-C: 66 mg/dL (ref >39)
LDL Particle Number: 776 nmol/L (ref <1000)
LDL Size: 20.9 nm (ref >20.5)
LDL-C (NIH Calc): 78 mg/dL (ref 0–99)
LP-IR Score: 35 (ref <=45)
Small LDL Particle Number: 242 nmol/L (ref <=527)
Triglycerides: 120 mg/dL (ref 0–149)

## 2022-04-13 LAB — HEMOGLOBIN A1C
Est. average glucose Bld gHb Est-mCnc: 126 mg/dL
Hgb A1c MFr Bld: 6 % — ABNORMAL HIGH (ref 4.8–5.6)

## 2022-04-13 LAB — BASIC METABOLIC PANEL
BUN/Creatinine Ratio: 13 (ref 10–24)
BUN: 11 mg/dL (ref 8–27)
CO2: 27 mmol/L (ref 20–29)
Calcium: 9.3 mg/dL (ref 8.6–10.2)
Chloride: 104 mmol/L (ref 96–106)
Creatinine, Ser: 0.84 mg/dL (ref 0.76–1.27)
Glucose: 79 mg/dL (ref 70–99)
Potassium: 4.8 mmol/L (ref 3.5–5.2)
Sodium: 145 mmol/L — ABNORMAL HIGH (ref 134–144)
eGFR: 88 mL/min/{1.73_m2} (ref >59)

## 2022-04-19 ENCOUNTER — Telehealth: Payer: Self-pay | Admitting: Internal Medicine

## 2022-04-19 NOTE — Telephone Encounter (Signed)
Daughter called stating POA was faxed to office and wants her or her husband contacted for appointments as her dad has dementia or if the office has not received the POA.  Daughter stated her husband Shanon Brow Dusenberg)'s phone number is [phone].

## 2022-04-20 ENCOUNTER — Telehealth: Payer: Self-pay

## 2022-04-20 ENCOUNTER — Other Ambulatory Visit: Payer: Self-pay | Admitting: Internal Medicine

## 2022-04-20 MED ORDER — AMLODIPINE BESYLATE 10 MG PO TABS: 10.0000 mg | ORAL_TABLET | Freq: Every day | ORAL | 3 refills | Status: AC

## 2022-04-20 NOTE — Telephone Encounter (Signed)
Left a message for the pt to call back. My Chart message also sent to the pt.

## 2022-04-20 NOTE — Telephone Encounter (Signed)
-----   Message from Fay Records, MD sent at 04/12/2022 10:15 PM EDT ----- Pt should go to  10 mg amlodipine   He is on 5 now   Not sure if I misspoke

## 2022-04-20 NOTE — Telephone Encounter (Signed)
Pt's daughter advised.  

## 2022-05-04 ENCOUNTER — Encounter: Payer: Self-pay | Admitting: Internal Medicine

## 2022-05-04 ENCOUNTER — Telehealth: Payer: Self-pay | Admitting: Internal Medicine

## 2022-05-04 NOTE — Telephone Encounter (Signed)
Received call from Kathlene November from [employer] about [employer]. Saw [employer] in pt's chart, however I called pt's number in chart. Son in law Onalee Hua answered and stated pt has Dementia and may have given an old insurance card the day of his lab appt 04/12/22. Onalee Hua states he will c/b with updated insurance card.

## 2022-05-04 NOTE — Telephone Encounter (Signed)
Error

## 2022-05-12 DIAGNOSIS — G4730 Sleep apnea, unspecified: Secondary | ICD-10-CM | POA: Diagnosis not present

## 2022-05-12 DIAGNOSIS — E785 Hyperlipidemia, unspecified: Secondary | ICD-10-CM | POA: Diagnosis not present

## 2022-05-12 DIAGNOSIS — I10 Essential (primary) hypertension: Secondary | ICD-10-CM | POA: Diagnosis not present

## 2022-05-12 DIAGNOSIS — R5383 Other fatigue: Secondary | ICD-10-CM | POA: Diagnosis not present

## 2022-05-14 NOTE — Telephone Encounter (Signed)
[name] from [employer] is calling back for pt's updated insurance information. They are trying to file his insurance from when he got his labs drawn 04/12/22, but they need correct insurance info. Please advise.

## 2022-05-21 DIAGNOSIS — H02834 Dermatochalasis of left upper eyelid: Secondary | ICD-10-CM | POA: Diagnosis not present

## 2022-05-21 DIAGNOSIS — H2513 Age-related nuclear cataract, bilateral: Secondary | ICD-10-CM | POA: Diagnosis not present

## 2022-05-21 DIAGNOSIS — H524 Presbyopia: Secondary | ICD-10-CM | POA: Diagnosis not present

## 2022-05-21 DIAGNOSIS — H5211 Myopia, right eye: Secondary | ICD-10-CM | POA: Diagnosis not present

## 2022-05-21 DIAGNOSIS — H40053 Ocular hypertension, bilateral: Secondary | ICD-10-CM | POA: Diagnosis not present

## 2022-05-21 DIAGNOSIS — H02831 Dermatochalasis of right upper eyelid: Secondary | ICD-10-CM | POA: Diagnosis not present

## 2022-05-25 ENCOUNTER — Encounter (HOSPITAL_COMMUNITY): Payer: Self-pay | Admitting: Emergency Medicine

## 2022-05-25 ENCOUNTER — Emergency Department (HOSPITAL_COMMUNITY)
Admission: EM | Admit: 2022-05-25 | Discharge: 2022-05-25 | Disposition: A | Discharge status: Home / Self Care | Payer: HMO | Attending: Emergency Medicine | Admitting: Emergency Medicine

## 2022-05-25 ENCOUNTER — Other Ambulatory Visit: Payer: Self-pay

## 2022-05-25 ENCOUNTER — Telehealth: Payer: Self-pay | Admitting: Internal Medicine

## 2022-05-25 ENCOUNTER — Emergency Department (HOSPITAL_COMMUNITY): Payer: HMO

## 2022-05-25 DIAGNOSIS — R42 Dizziness and giddiness: Secondary | ICD-10-CM | POA: Diagnosis not present

## 2022-05-25 DIAGNOSIS — I10 Essential (primary) hypertension: Secondary | ICD-10-CM | POA: Insufficient documentation

## 2022-05-25 DIAGNOSIS — R0602 Shortness of breath: Secondary | ICD-10-CM | POA: Diagnosis not present

## 2022-05-25 DIAGNOSIS — R0902 Hypoxemia: Secondary | ICD-10-CM | POA: Diagnosis not present

## 2022-05-25 DIAGNOSIS — E876 Hypokalemia: Secondary | ICD-10-CM | POA: Diagnosis not present

## 2022-05-25 DIAGNOSIS — R002 Palpitations: Secondary | ICD-10-CM | POA: Diagnosis not present

## 2022-05-25 DIAGNOSIS — Z79899 Other long term (current) drug therapy: Secondary | ICD-10-CM | POA: Insufficient documentation

## 2022-05-25 DIAGNOSIS — E871 Hypo-osmolality and hyponatremia: Secondary | ICD-10-CM | POA: Insufficient documentation

## 2022-05-25 DIAGNOSIS — I471 Supraventricular tachycardia, unspecified: Secondary | ICD-10-CM

## 2022-05-25 DIAGNOSIS — I447 Left bundle-branch block, unspecified: Secondary | ICD-10-CM | POA: Diagnosis not present

## 2022-05-25 DIAGNOSIS — R000 Tachycardia, unspecified: Secondary | ICD-10-CM | POA: Diagnosis not present

## 2022-05-25 LAB — BASIC METABOLIC PANEL
Anion gap: 12 (ref 5–15)
BUN: 14 mg/dL (ref 8–23)
CO2: 24 mmol/L (ref 22–32)
Calcium: 8.9 mg/dL (ref 8.9–10.3)
Chloride: 98 mmol/L (ref 98–111)
Creatinine, Ser: 0.94 mg/dL (ref 0.61–1.24)
GFR, Estimated: >60 mL/min (ref >60)
Glucose, Bld: 104 mg/dL — ABNORMAL HIGH (ref 70–99)
Potassium: 3 mmol/L — ABNORMAL LOW (ref 3.5–5.1)
Sodium: 134 mmol/L — ABNORMAL LOW (ref 135–145)

## 2022-05-25 LAB — CBC
HCT: 39.5 % (ref 39.0–52.0)
Hemoglobin: 13.6 g/dL (ref 13.0–17.0)
MCH: 32.7 pg (ref 26.0–34.0)
MCHC: 34.4 g/dL (ref 30.0–36.0)
MCV: 95 fL (ref 80.0–100.0)
Platelets: 207 10*3/uL (ref 150–400)
RBC: 4.16 MIL/uL — ABNORMAL LOW (ref 4.22–5.81)
RDW: 13.1 % (ref 11.5–15.5)
WBC: 8.4 10*3/uL (ref 4.0–10.5)
nRBC: 0 % (ref 0.0–0.2)

## 2022-05-25 LAB — MAGNESIUM: Magnesium: 1.8 mg/dL (ref 1.7–2.4)

## 2022-05-25 MED ORDER — POTASSIUM CHLORIDE ER 10 MEQ PO TBCR: 10.0000 meq | EXTENDED_RELEASE_TABLET | Freq: Every day | ORAL | 0 refills | Status: DC

## 2022-05-25 MED ORDER — POTASSIUM CHLORIDE CRYS ER 20 MEQ PO TBCR
40.0000 meq | EXTENDED_RELEASE_TABLET | Freq: Once | ORAL | Status: AC
  Administered 2022-05-25: 40 meq via ORAL
  Filled 2022-05-25: qty 2

## 2022-05-25 NOTE — ED Provider Triage Note (Signed)
Emergency Medicine Provider Triage Evaluation Note  Alexandra Mosqueda , a 82 y.o. male  was evaluated in triage.  Pt complains of feeling some jaw pain, while on the phone with his daughter, she encouraged him to call 911, on arrival he was noted to be in SVT.  Patient stood up and converted spontaneously normal sinus rhythm.  Patient reports that he was not feeling abnormal prior to this, he denies any chest pain, shortness of breath, he denies any fever, chills, patient reports that he has been in SVT previously.  History of hypertension, hyperlipidemia, no history of diabetes.  Patient has not missed any of his normal medications.  Review of Systems  Positive: Abnormal heart rhythm Negative: Chest pain, shortness of breath  Physical Exam  BP 114/71 (BP Location: Right Arm)   Pulse 80   Temp 98.2 F (36.8 C) (Oral)   Resp 18   SpO2 95%  Gen:   Awake, no distress   Resp:  Normal effort  MSK:   Moves extremities without difficulty  Other:    Medical Decision Making  Medically screening exam initiated at 12:58 PM.  Appropriate orders placed.  Abaan Schurman was informed that the remainder of the evaluation will be completed by another provider, this initial triage assessment does not replace that evaluation, and the importance of remaining in the ED until their evaluation is complete.  Workup initiated in triage    Olene Floss, PA-C 05/25/22 1300

## 2022-05-25 NOTE — Telephone Encounter (Signed)
With recurrence of SVT now I would recomm referral to EP to evaluate.

## 2022-05-25 NOTE — ED Provider Notes (Signed)
[place] EMERGENCY DEPARTMENT AT [facility] Provider Note   CSN: [id number] Arrival date & time: 05/25/22  1234     History  Chief Complaint  Patient presents with   SVT    [name] is a 82 y.o. male Pt complains of feeling some jaw pain, while on the phone with his daughter, she encouraged him to call 911, on arrival he was noted to be in SVT.  Patient stood up and converted spontaneously normal sinus rhythm.  Patient reports that he was not feeling abnormal prior to this, he denies any chest pain, shortness of breath, he denies any fever, chills, patient reports that he has been in SVT previously.  History of hypertension, hyperlipidemia, no history of diabetes.  Patient has not missed any of his normal medications.   HPI     Home Medications Prior to Admission medications   Medication Sig Start Date End Date Taking? Authorizing Provider  potassium chloride (KLOR-CON) 10 MEQ tablet Take 1 tablet (10 mEq total) by mouth daily. 05/25/22  Yes Aizah Gehlhausen H, PA-C  amLODipine (NORVASC) 10 MG tablet Take 1 tablet (10 mg total) by mouth daily. 04/20/22   Pricilla Riffle, MD  atenolol (TENORMIN) 25 MG tablet TAKE 1 TABLET BY MOUTH EVERY NIGHT AT BEDTIME 04/20/22   Pricilla Riffle, MD  atorvastatin (LIPITOR) 20 MG tablet  08/25/17   [provider]  fexofenadine (ALLEGRA) 180 MG tablet Take 180 mg by mouth daily. Patient not taking: Reported on 04/12/2022    [provider]  telmisartan-hydrochlorothiazide (MICARDIS HCT) 80-12.5 MG tablet  09/26/17   [provider]      Allergies    Pollen extract    Review of Systems   Review of Systems  All other systems reviewed and are negative.   Physical Exam Updated Vital Signs BP 114/71 (BP Location: Right Arm)   Pulse 80   Temp 98.2 F (36.8 C) (Oral)   Resp 18   SpO2 95%  Physical Exam Vitals and nursing note reviewed.  Constitutional:      General: He is not in acute distress.     Appearance: Normal appearance.  HENT:     Head: Normocephalic and atraumatic.  Eyes:     General:        Right eye: No discharge.        Left eye: No discharge.  Cardiovascular:     Rate and Rhythm: Normal rate and regular rhythm.     Heart sounds: No murmur heard.    No friction rub. No gallop.  Pulmonary:     Effort: Pulmonary effort is normal.     Breath sounds: Normal breath sounds.  Abdominal:     General: Bowel sounds are normal.     Palpations: Abdomen is soft.  Skin:    General: Skin is warm and dry.     Capillary Refill: Capillary refill takes less than 2 seconds.  Neurological:     Mental Status: He is alert and oriented to person, place, and time.  Psychiatric:        Mood and Affect: Mood normal.        Behavior: Behavior normal.     ED Results / Procedures / Treatments   Labs (all labs ordered are listed, but only abnormal results are displayed) Labs Reviewed  CBC - Abnormal; Notable for the following components:      Result Value   RBC 4.16 (*)    All other components  within normal limits  BASIC METABOLIC PANEL - Abnormal; Notable for the following components:   Sodium 134 (*)    Potassium 3.0 (*)    Glucose, Bld 104 (*)    All other components within normal limits  MAGNESIUM    EKG None  Radiology DG Chest 2 View  Result Date: 05/25/2022 CLINICAL DATA:  Shortness of breath EXAM: CHEST - 2 VIEW COMPARISON:  CXR 01/29/22 FINDINGS: No pleural effusion. No pneumothorax. No focal airspace opacity. Unchanged cardiac and mediastinal contours. Asymmetric elevation of the left hemidiaphragm, unchanged from prior exam. No radiographically apparent displaced rib fractures. Visualized upper abdomen is unremarkable. Vertebral body heights are maintained. IMPRESSION: No focal airspace opacity. Electronically Signed   By: Lorenza Cambridge M.D.   On: 05/25/2022 14:04    Procedures Procedures    Medications Ordered in ED Medications  potassium chloride SA (KLOR-CON  M) CR tablet 40 mEq (40 mEq Oral Given 05/25/22 1428)    ED Course/ Medical Decision Making/ A&P Clinical Course as of 05/25/22 1445  Tue May 25, 2022  2318 82 year old male here with recurrent SVT.  Patient converted on own prior to intervention.  Otherwise feels well now.  Has seen cardiology in the past.  Labs showing low potassium which has been repleted.  Will need outpatient cardiology follow-up. [MB]    Clinical Course User Index [MB] Terrilee Files, MD                             Medical Decision Making Amount and/or Complexity of Data Reviewed Labs: ordered. Radiology: ordered.   This patient is a 82 y.o. male  who presents to the ED for concern of SVT which spontaneously converted prior to arrival without chest pain, shortness of breath.   Differential diagnoses prior to evaluation: The emergent differential diagnosis includes, but is not limited to, electrolyte abnormality causing SVT, ACS or other cardiac abnormality causing SVT, versus other cardiac arrhythmia, versus other. This is not an exhaustive differential.   Past Medical History / Co-morbidities: Previous history of SVT, hypertension, hyperlipidemia  Additional history: Chart reviewed. Pertinent results include: Reviewed outpatient cardiology, neurology visits, patient with previous evaluation for SVT  Physical Exam: Physical exam performed. The pertinent findings include: Patient with return of normal sinus rhythm on my exam, his vital signs are stable, he is in no acute distress, he has no tenderness to palpation over chest wall.  Lab Tests/Imaging studies: I personally interpreted labs/imaging and the pertinent results include: CBC notable for mild hypokalemia, potassium 3.0, we will orally replete, mild hyponatremia as well sodium 134.  CBC unremarkable, magnesium within normal range..  I independently interpreted plain film chest x-ray which shows no evidence of acute intrathoracic abnormality.  I agree  with the radiologist interpretation.  Cardiac monitoring: EKG obtained and interpreted by my attending physician which shows: Normal sinus rhythm on reevaluation   Medications: I ordered medication including potassium orally.  I have reviewed the patients home medicines and have made adjustments as needed.   Disposition: After consideration of the diagnostic results and the patients response to treatment, I feel that patient with no return of SVT in the emergency department, he did have low potassium, we will orally repleted and discharged with additional repletion for 10 days, encourage close PCP, cardiology follow-up.  He is back in sinus rhythm at this time no evidence of any additional cardiac abnormality.  emergency department workup does not suggest an  emergent condition requiring admission or immediate intervention beyond what has been performed at this time. The plan is: as above. The patient is safe for discharge and has been instructed to return immediately for worsening symptoms, change in symptoms or any other concerns.  Final Clinical Impression(s) / ED Diagnoses Final diagnoses:  SVT (supraventricular tachycardia)  Hypokalemia    Rx / DC Orders ED Discharge Orders          Ordered    potassium chloride (KLOR-CON) 10 MEQ tablet  Daily        05/25/22 1426    Ambulatory referral to Cardiology       Comments: If you have not heard from the Cardiology office within the next 72 hours please call [phone].   05/25/22 1435              Jodiann Ognibene, Harrel Carina, PA-C 05/25/22 1445    Terrilee Files, MD 05/26/22 [phone]

## 2022-05-25 NOTE — Telephone Encounter (Signed)
Called pt today from referral queue. Pt wife states would rather have Dr. Tenny Craw review notes from ED to see if he needs to change anything or not. Please advise.

## 2022-05-25 NOTE — Telephone Encounter (Signed)
Patient was in the ER for SVT and was advised to follow up with cardiology. He wants to know if Dr. Tenny Craw thinks that he needs to be seen at this time or make any changes to his plan of care.

## 2022-05-25 NOTE — ED Triage Notes (Signed)
Pt from home via [facility] with reports of nausea and weakness that started this morning. When EMS arrived pt was sitting on the front porch and was noted to be in SVT. Pt stood up and converted to a NSR. Pt with no complaints at this time.

## 2022-05-25 NOTE — ED Notes (Signed)
Daughter Maida Sale [phone]

## 2022-05-25 NOTE — Discharge Instructions (Signed)
Please take the potassium supplement that I prescribed, drink some electrolyte containing fluids such as Gatorade, Pedialyte, and follow-up closely with your cardiologist, PCP for recheck of your potassium within the next 1 to 2 weeks.  Please return the emergency department if you develop chest pain, shortness of breath, or go into an abnormal heart rhythm once again.

## 2022-05-26 NOTE — Telephone Encounter (Signed)
Pt gave okay to talk with his son in law:  (Dr. Onalee Hua Dusenberg)'s phone number is [phone].   He agrees to an EP referral.

## 2022-05-28 ENCOUNTER — Ambulatory Visit: Payer: HMO | Attending: Cardiovascular Disease | Admitting: Cardiovascular Disease

## 2022-05-28 ENCOUNTER — Encounter: Payer: Self-pay | Admitting: Cardiovascular Disease

## 2022-05-28 ENCOUNTER — Telehealth: Payer: Self-pay | Admitting: Cardiovascular Disease

## 2022-05-28 VITALS — BP 126/80 | HR 72 | Ht 69.5 in | Wt 190.2 lb

## 2022-05-28 DIAGNOSIS — I471 Supraventricular tachycardia, unspecified: Secondary | ICD-10-CM

## 2022-05-28 NOTE — Telephone Encounter (Signed)
Patient's son Onalee Hua called to inform us that the patient has Dementia. Onalee Hua did state that he will be there today at the patient's appointment to help, but Onalee Hua wanted [place] to be aware that we may have difficulties with getting the patient's medical history. Onalee Hua did state he will be there to help during the appointment and would like to be in the loop of the patients medical follow ups. Onalee Hua does not need a call back about this matter, he would like Dr. Nelly Laurence and Dr. Morrie Sheldon nurse to be aware of the situation.

## 2022-05-28 NOTE — Progress Notes (Signed)
Electrophysiology Office Note:    Date:  05/28/2022   ID:  [name], DOB 09-09-1940, MRN [id number]  PCP:  [name] @ [facility] Providers Cardiologist:  None     Referring MD: Craig Riffle, MD   History of Present Illness:    [name] is a 82 y.o. male with a hx listed below, significant for HTN, ICH referred for arrhythmia management.  He presented to the ER in January 2024 with rapid palpitations and nausea.  He was found to be in SVT with a ventricular rate of 194 bpm.  He was placed on atenolol 25 mg and sent home with a 30-day monitor.  The monitor did not show any arrhythmia.  The patient's daughter, [name], and son-in-law, [name] (a psychiatrist), participated in the interview today by speaker phone.  I obtained a separate history from [name] since the patient has some dementia.  She notes that over the years, he has had sudden attacks of rapid palpitations with malaise, fatigue,  and chest and jaw pain.  This occurred in January while they were in the hospital seeing the patient's wife, who was admitted.  He was quickly taken to the ER where EKG showed SVT.  He has not had syncope during these episodes but does feel weak.  He drives to once in [place] daily to see his wife.     Past Medical History:  Diagnosis Date   Essential hypertension, benign    Essential tremor    hands bilat more in right    H/O urinary frequency    with use of furosemide as stated per pt    Headache    hx of with ICH 2011   Hemorrhoids    Hyperlipidemia    Intracranial hemorrhage (HCC) 1/12   Acute right occipital hemorrhage with Jimenez right parietal subdural hematoma   Lower leg edema    bilat comes and goes has been prescribed furosemide to aide in relieve pt has currently not been using due to causing incontinence as stated per pt    MCI (mild cognitive impairment) 09/11/2020   Obstructive sleep apnea    not using CPAP machine     Osteoarthritis    Prostate cancer (HCC)    Radiation 09/06/13-11/06/13   prostate 78 gray   Tremor, essential 09/11/2020   Urinary incontinence    with use of furosemide as stated per pt    Vitamin B12 deficiency 09/15/2020    Past Surgical History:  Procedure Laterality Date   Arthroscopic right knee surgery     menicus tear     right knee 30 years ago was surgically repaired   TOTAL KNEE ARTHROPLASTY Right 10/21/2014   Procedure: TOTAL RIGHT KNEE ARTHROPLASTY;  Surgeon: Craig Romans, MD;  Location: [facility];  Service: Orthopedics;  Laterality: Right;    Current Medications: Current Meds  Medication Sig   amLODipine (NORVASC) 10 MG tablet Take 1 tablet (10 mg total) by mouth daily.   atenolol (TENORMIN) 25 MG tablet TAKE 1 TABLET BY MOUTH EVERY NIGHT AT BEDTIME   atorvastatin (LIPITOR) 20 MG tablet    fexofenadine (ALLEGRA) 180 MG tablet Take 180 mg by mouth daily.   potassium chloride (KLOR-CON) 10 MEQ tablet Take 1 tablet (10 mEq total) by mouth daily.   telmisartan-hydrochlorothiazide (MICARDIS HCT) 80-12.5 MG tablet      Allergies:   Pollen extract   Social and Family History: Reviewed in Epic  ROS:   Please see  the history of present illness.    All other systems reviewed and are negative.  EKGs/Labs/Other Studies Reviewed Today:    Echocardiogram:  TTE 01/29/2022 EF 55%.  Normal structure and function   Monitors: my interpretation   30 day [employer] Sinus rhythm  HR 42-102, avg 63 No arrhythmia detected  Stress testing:   Advanced imaging:   Cardiac catherization   EKG:  Last EKG results: Today -- normal sinus rhythm with nonspecific intraventricular conduction delay I reviewed all electrocardiograms available in MUSE.  No evidence of preexcitation.  Recent Labs: 01/29/2022: ALT 22 05/25/2022: BUN 14; Creatinine, Ser 0.94; Hemoglobin 13.6; Magnesium 1.8; Platelets 207; Potassium 3.0; Sodium 134     Physical Exam:    VS:  BP 126/80   Pulse 72    Ht 5' 9.5" (1.765 m)   Wt 190 lb 3.2 oz (86.3 kg)   SpO2 96%   BMI 27.68 kg/m     Wt Readings from Last 3 Encounters:  05/28/22 190 lb 3.2 oz (86.3 kg)  04/12/22 188 lb 6.4 oz (85.5 kg)  09/11/20 205 lb 3.2 oz (93.1 kg)     GEN:  Well nourished, well developed in no acute distress CARDIAC: RRR, no murmurs, rubs, gallops RESPIRATORY:  Normal work of breathing MUSCULOSKELETAL: no edema    ASSESSMENT & PLAN:    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  Concealed septal pathway cannot be excluded I explained the indication and rationale for electrophysiology study.  The patient daughter and son in law would like to proceed  We discussed the indication, rationale, logistics, anticipated benefits, and potential risks of the ablation procedure including but not limited to -- bleed at the groin access site; need for a temporary drainage tube, pacemaker, or prolonged hospitalization. I explained that the risk for stroke, heart attack, need for open chest surgery, or even death is very low but not zero. he  expressed understanding and wishes to proceed.   Dementia Appears mild during our conversation today. There may be some memory defecits I would plan to start with light anesthesia in order to induce SVT, but we will be prepared to use deeper anesthesia or even general anesthesia if need be        Medication Adjustments/Labs and Tests Ordered: Current medicines are reviewed at length with the patient today.  Concerns regarding medicines are outlined above.  No orders of the defined types were placed in this encounter.  No orders of the defined types were placed in this encounter.    Signed, Craig Small, MD  05/28/2022 2:56 PM    [facility]

## 2022-05-28 NOTE — Telephone Encounter (Signed)
Patient's daughter is calling wanting to know if PET scan is still needed due to Dr. Nelly Laurence knowing what was going on with the patient and scheduling him for an ablation. Please advise.

## 2022-05-28 NOTE — Patient Instructions (Signed)
Medication Instructions:  Your physician recommends that you continue on your current medications as directed. Please refer to the Current Medication list given to you today. *If you need a refill on your cardiac medications before your next appointment, please call your pharmacy*  Testing/Procedures: SVT Ablation Your physician has recommended that you have an ablation. Catheter ablation is a medical procedure used to treat some cardiac arrhythmias (irregular heartbeats). During catheter ablation, a long, thin, flexible tube is put into a blood vessel in your groin (upper thigh), or neck. This tube is called an ablation catheter. It is then guided to your heart through the blood vessel. Radio frequency waves destroy small areas of heart tissue where abnormal heartbeats may cause an arrhythmia to start. Please see the instruction sheet given to you today.  You are scheduled for SVT Ablation on Monday, November 11 with Dr. Halford Chessman.Please arrive at the Main Entrance A at [facility]: [address] [place], [place] [address]    Follow-Up: At [facility], you and your health needs are our priority.  As part of our continuing mission to provide you with exceptional heart care, we have created designated Provider Care Teams.  These Care Teams include your primary Cardiologist (physician) and Advanced Practice Providers (APPs -  Physician Assistants and Nurse Practitioners) who all work together to provide you with the care you need, when you need it.  We recommend signing up for the patient portal called "MyChart".  Sign up information is provided on this After Visit Summary.  MyChart is used to connect with patients for Virtual Visits (Telemedicine).  Patients are able to view lab/test results, encounter notes, upcoming appointments, etc.  Non-urgent messages can be sent to your provider as well.   To learn more about what you can do with MyChart, go to [email or web address].     Your next appointment:   11/15/22 - see appointment page for details  Provider:   York Pellant, MD

## 2022-06-01 NOTE — Telephone Encounter (Signed)
Festus Holts, RN  You4 days ago    Elvina Sidle, I would assume this PET scan that Dr Tenny Craw ordered would still be needed but I told the patient's daughter Jill Side, updated DPR received today in office) that I would forward her question to you. If you could just confirm and contact Jill Side or Onalee Hua (son-in-law) back - patient is getting dementia per family.  Thanks, Brayton Caves

## 2022-06-02 ENCOUNTER — Telehealth: Payer: Self-pay | Admitting: Cardiovascular Disease

## 2022-06-02 NOTE — Telephone Encounter (Signed)
Spoke with daughter Jill Side, [place] on file) to reschedule ablation, discussed available options - she will double check dates with family and I will contact them back on Friday afternoon to confirm dates/reschedule.

## 2022-06-02 NOTE — Telephone Encounter (Signed)
Patient's daughter is requesting call back to discuss upcoming procedure and if they can change the date of this procedure. Please advise.

## 2022-06-02 NOTE — Telephone Encounter (Signed)
Patient's daughter is calling back to update the appt would need to be moved to after Christmas 12/27 or after.   She would also like an update from Dr. Charlott Rakes nurse regarding PET scan.

## 2022-06-03 ENCOUNTER — Telehealth: Payer: Self-pay | Admitting: Cardiovascular Disease

## 2022-06-03 NOTE — Telephone Encounter (Signed)
Reviewed note of G Mealor Cancel PET / CT for now

## 2022-06-03 NOTE — Telephone Encounter (Signed)
Pt's daughter was calling nurse back about rescheduling ablation after speaking with family and she's requesting a call back. Please advise

## 2022-06-04 ENCOUNTER — Telehealth: Payer: Self-pay

## 2022-06-04 ENCOUNTER — Telehealth: Payer: Self-pay | Admitting: Cardiovascular Disease

## 2022-06-04 NOTE — Telephone Encounter (Signed)
Called patient's daughter Jill Side), no answer, left message to call back

## 2022-06-04 NOTE — Telephone Encounter (Signed)
Spoke with Jill Side (daughter) and confirmed rescheduled ablation and rescheduled office visit prior to. No further needs at this time

## 2022-06-04 NOTE — Telephone Encounter (Signed)
Pts daughter also wanted to let Dr Mealor/ Verdon Cummins know that she would like for the pts ablation to be put on for 12/17/22.   I will forward to his nurse for review.

## 2022-06-04 NOTE — Telephone Encounter (Signed)
Daughter stated patient wants to have procedure on 11/29.  Daughter wants call back from RN Brayton Caves to confirm.

## 2022-06-04 NOTE — Telephone Encounter (Signed)
Patient added.

## 2022-06-04 NOTE — Telephone Encounter (Signed)
Spoke with daughter about moving ablation date up - she will check with family and call back on Monday

## 2022-06-04 NOTE — Telephone Encounter (Signed)
Pts daughter advised and order cancelled.  [employer] [phone]

## 2022-06-07 NOTE — Telephone Encounter (Signed)
Spoke with Daughter and she states that they will need to keep his ablation scheduled as is, in November. They are not able to accommodate a sooner appointment date.   If anything changes, she will call back.

## 2022-06-16 ENCOUNTER — Other Ambulatory Visit (HOSPITAL_COMMUNITY): Payer: HMO

## 2022-08-11 DIAGNOSIS — F0390 Unspecified dementia without behavioral disturbance: Secondary | ICD-10-CM | POA: Diagnosis not present

## 2022-08-12 DIAGNOSIS — F0390 Unspecified dementia without behavioral disturbance: Secondary | ICD-10-CM | POA: Diagnosis not present

## 2022-08-31 DIAGNOSIS — R5381 Other malaise: Secondary | ICD-10-CM | POA: Diagnosis not present

## 2022-08-31 DIAGNOSIS — G309 Alzheimer's disease, unspecified: Secondary | ICD-10-CM | POA: Diagnosis not present

## 2022-08-31 DIAGNOSIS — I10 Essential (primary) hypertension: Secondary | ICD-10-CM | POA: Diagnosis not present

## 2022-08-31 DIAGNOSIS — F39 Unspecified mood [affective] disorder: Secondary | ICD-10-CM | POA: Diagnosis not present

## 2022-08-31 DIAGNOSIS — F0390 Unspecified dementia without behavioral disturbance: Secondary | ICD-10-CM | POA: Diagnosis not present

## 2022-09-02 DIAGNOSIS — J069 Acute upper respiratory infection, unspecified: Secondary | ICD-10-CM | POA: Diagnosis not present

## 2022-09-02 DIAGNOSIS — J9601 Acute respiratory failure with hypoxia: Secondary | ICD-10-CM | POA: Diagnosis not present

## 2022-09-02 DIAGNOSIS — F39 Unspecified mood [affective] disorder: Secondary | ICD-10-CM | POA: Diagnosis not present

## 2022-09-02 DIAGNOSIS — F0390 Unspecified dementia without behavioral disturbance: Secondary | ICD-10-CM | POA: Diagnosis not present

## 2022-09-02 DIAGNOSIS — R5381 Other malaise: Secondary | ICD-10-CM | POA: Diagnosis not present

## 2022-09-02 DIAGNOSIS — I10 Essential (primary) hypertension: Secondary | ICD-10-CM | POA: Diagnosis not present

## 2022-09-07 DIAGNOSIS — J069 Acute upper respiratory infection, unspecified: Secondary | ICD-10-CM | POA: Diagnosis not present

## 2022-09-07 DIAGNOSIS — I10 Essential (primary) hypertension: Secondary | ICD-10-CM | POA: Diagnosis not present

## 2022-09-07 DIAGNOSIS — J9601 Acute respiratory failure with hypoxia: Secondary | ICD-10-CM | POA: Diagnosis not present

## 2022-09-07 DIAGNOSIS — R5381 Other malaise: Secondary | ICD-10-CM | POA: Diagnosis not present

## 2022-09-07 DIAGNOSIS — F0390 Unspecified dementia without behavioral disturbance: Secondary | ICD-10-CM | POA: Diagnosis not present

## 2022-09-07 DIAGNOSIS — G309 Alzheimer's disease, unspecified: Secondary | ICD-10-CM | POA: Diagnosis not present

## 2022-09-09 DIAGNOSIS — I10 Essential (primary) hypertension: Secondary | ICD-10-CM | POA: Diagnosis not present

## 2022-09-09 DIAGNOSIS — G309 Alzheimer's disease, unspecified: Secondary | ICD-10-CM | POA: Diagnosis not present

## 2022-09-09 DIAGNOSIS — R5381 Other malaise: Secondary | ICD-10-CM | POA: Diagnosis not present

## 2022-09-09 DIAGNOSIS — F0390 Unspecified dementia without behavioral disturbance: Secondary | ICD-10-CM | POA: Diagnosis not present

## 2022-10-01 DIAGNOSIS — J9601 Acute respiratory failure with hypoxia: Secondary | ICD-10-CM | POA: Diagnosis not present

## 2022-10-01 DIAGNOSIS — R5381 Other malaise: Secondary | ICD-10-CM | POA: Diagnosis not present

## 2022-10-01 DIAGNOSIS — G309 Alzheimer's disease, unspecified: Secondary | ICD-10-CM | POA: Diagnosis not present

## 2022-10-01 DIAGNOSIS — F0390 Unspecified dementia without behavioral disturbance: Secondary | ICD-10-CM | POA: Diagnosis not present

## 2022-10-01 DIAGNOSIS — I10 Essential (primary) hypertension: Secondary | ICD-10-CM | POA: Diagnosis not present

## 2022-10-08 DIAGNOSIS — F02A3 Dementia in other diseases classified elsewhere, mild, with mood disturbance: Secondary | ICD-10-CM | POA: Diagnosis not present

## 2022-10-08 DIAGNOSIS — E538 Deficiency of other specified B group vitamins: Secondary | ICD-10-CM | POA: Diagnosis not present

## 2022-10-08 DIAGNOSIS — G301 Alzheimer's disease with late onset: Secondary | ICD-10-CM | POA: Diagnosis not present

## 2022-10-08 DIAGNOSIS — R251 Tremor, unspecified: Secondary | ICD-10-CM | POA: Diagnosis not present

## 2022-10-14 DIAGNOSIS — G309 Alzheimer's disease, unspecified: Secondary | ICD-10-CM | POA: Diagnosis not present

## 2022-10-14 DIAGNOSIS — F39 Unspecified mood [affective] disorder: Secondary | ICD-10-CM | POA: Diagnosis not present

## 2022-10-14 DIAGNOSIS — I10 Essential (primary) hypertension: Secondary | ICD-10-CM | POA: Diagnosis not present

## 2022-10-14 DIAGNOSIS — F0390 Unspecified dementia without behavioral disturbance: Secondary | ICD-10-CM | POA: Diagnosis not present

## 2022-10-14 DIAGNOSIS — R5381 Other malaise: Secondary | ICD-10-CM | POA: Diagnosis not present

## 2022-10-17 DIAGNOSIS — F0390 Unspecified dementia without behavioral disturbance: Secondary | ICD-10-CM | POA: Diagnosis not present

## 2022-10-17 DIAGNOSIS — I10 Essential (primary) hypertension: Secondary | ICD-10-CM | POA: Diagnosis not present

## 2022-10-17 DIAGNOSIS — F39 Unspecified mood [affective] disorder: Secondary | ICD-10-CM | POA: Diagnosis not present

## 2022-10-17 DIAGNOSIS — G309 Alzheimer's disease, unspecified: Secondary | ICD-10-CM | POA: Diagnosis not present

## 2022-10-17 DIAGNOSIS — F0280 Dementia in other diseases classified elsewhere without behavioral disturbance: Secondary | ICD-10-CM | POA: Diagnosis not present

## 2022-11-03 ENCOUNTER — Telehealth: Payer: Self-pay | Admitting: Cardiovascular Disease

## 2022-11-03 DIAGNOSIS — G309 Alzheimer's disease, unspecified: Secondary | ICD-10-CM | POA: Diagnosis not present

## 2022-11-03 NOTE — Telephone Encounter (Signed)
Spoke with daughter, Jill Side (DPR on file), patient has been moved to a facility in [place] and would like Dr. Tenny Craw' opinion/referral for a general cardiologist in that area. Will forward to Dr. Tenny Craw' and her nurse for advice.

## 2022-11-03 NOTE — Telephone Encounter (Signed)
Pt's daughter is trying to get a referral for the pt sent over to an office in [facility] because he has moved to a facility out there. Please advise

## 2022-11-04 DIAGNOSIS — G309 Alzheimer's disease, unspecified: Secondary | ICD-10-CM | POA: Diagnosis not present

## 2022-11-05 NOTE — Telephone Encounter (Signed)
[facility] is in [facility]   It is a large group  Reviewing website, Drs Victoriano Lain, Tiburcio Pea and Powers all appear well trained  [facility] is also there   Down side = may end up with fellow, rotating doctors

## 2022-11-08 NOTE — Telephone Encounter (Signed)
My Chart with recommendations sent to the pt.

## 2022-11-10 ENCOUNTER — Telehealth: Payer: Self-pay

## 2022-11-10 ENCOUNTER — Telehealth: Payer: Self-pay | Admitting: Cardiovascular Disease

## 2022-11-10 ENCOUNTER — Encounter: Payer: Self-pay | Admitting: Cardiovascular Disease

## 2022-11-10 NOTE — Telephone Encounter (Signed)
Pt's daughter returning call to April

## 2022-11-10 NOTE — Telephone Encounter (Signed)
Spoke to pt's Daughter to discuss details and Instructions for SVT ablation. She received another phone call and stated she would call me back.

## 2022-11-10 NOTE — Telephone Encounter (Signed)
Pt's Daughter called back. She wasn't aware of his upcoming appointment with Dr. Nelly Laurence on 11/21. She isn't sure how to get him to that appt due to him being in the facility in [place]. They do not offer transportation this far.  I told her to find out if the facility could do his pre-procedure labwork and if so then I would ask Dr. Nelly Laurence if we could switch this appt to a Tele-visit.  She is aware that Dr. Nelly Laurence will be in the office tomorrow and I will speak to him regarding this and call her back.

## 2022-11-10 NOTE — Telephone Encounter (Signed)
Error

## 2022-11-11 NOTE — Telephone Encounter (Signed)
Spoke with pt's Daughter and advised her that per Dr. Nelly Laurence, he would like to see the pt In-Office prior to the procedure. She will arrange for him to get here by an [facility] Goad and she will be on the phone during his visit to assist with his visit.  I explained to her that we would be doing his pre-procedure labs that day (non fasting) and would go over procedure instructions and give him instructions to take back to the facility.  She ask if I would email a copy of the instructions to his charge RN just in case the paper doesn't make it back to her.   He is currently staying at [facility] in [place]. RN is [email or web address] Fax) [phone]

## 2022-11-14 DIAGNOSIS — I10 Essential (primary) hypertension: Secondary | ICD-10-CM | POA: Diagnosis not present

## 2022-11-14 DIAGNOSIS — G309 Alzheimer's disease, unspecified: Secondary | ICD-10-CM | POA: Diagnosis not present

## 2022-11-14 DIAGNOSIS — F0280 Dementia in other diseases classified elsewhere without behavioral disturbance: Secondary | ICD-10-CM | POA: Diagnosis not present

## 2022-11-14 DIAGNOSIS — F3181 Bipolar II disorder: Secondary | ICD-10-CM | POA: Diagnosis not present

## 2022-11-15 ENCOUNTER — Ambulatory Visit: Payer: HMO | Admitting: Cardiovascular Disease

## 2022-11-16 DIAGNOSIS — G309 Alzheimer's disease, unspecified: Secondary | ICD-10-CM | POA: Diagnosis not present

## 2022-11-18 DIAGNOSIS — I10 Essential (primary) hypertension: Secondary | ICD-10-CM | POA: Diagnosis not present

## 2022-11-18 DIAGNOSIS — G309 Alzheimer's disease, unspecified: Secondary | ICD-10-CM | POA: Diagnosis not present

## 2022-11-18 DIAGNOSIS — F0390 Unspecified dementia without behavioral disturbance: Secondary | ICD-10-CM | POA: Diagnosis not present

## 2022-11-18 DIAGNOSIS — F39 Unspecified mood [affective] disorder: Secondary | ICD-10-CM | POA: Diagnosis not present

## 2022-11-18 DIAGNOSIS — F0280 Dementia in other diseases classified elsewhere without behavioral disturbance: Secondary | ICD-10-CM | POA: Diagnosis not present

## 2022-11-19 DIAGNOSIS — G309 Alzheimer's disease, unspecified: Secondary | ICD-10-CM | POA: Diagnosis not present

## 2022-11-22 ENCOUNTER — Ambulatory Visit: Payer: HMO | Admitting: Cardiovascular Disease

## 2022-11-22 DIAGNOSIS — F0280 Dementia in other diseases classified elsewhere without behavioral disturbance: Secondary | ICD-10-CM | POA: Diagnosis not present

## 2022-11-22 DIAGNOSIS — R1310 Dysphagia, unspecified: Secondary | ICD-10-CM | POA: Diagnosis not present

## 2022-11-22 DIAGNOSIS — G250 Essential tremor: Secondary | ICD-10-CM | POA: Diagnosis not present

## 2022-11-22 DIAGNOSIS — G309 Alzheimer's disease, unspecified: Secondary | ICD-10-CM | POA: Diagnosis not present

## 2022-11-24 DIAGNOSIS — R32 Unspecified urinary incontinence: Secondary | ICD-10-CM | POA: Diagnosis not present

## 2022-11-24 DIAGNOSIS — C61 Malignant neoplasm of prostate: Secondary | ICD-10-CM | POA: Diagnosis not present

## 2022-12-05 DIAGNOSIS — F3181 Bipolar II disorder: Secondary | ICD-10-CM | POA: Diagnosis not present

## 2022-12-05 DIAGNOSIS — F0280 Dementia in other diseases classified elsewhere without behavioral disturbance: Secondary | ICD-10-CM | POA: Diagnosis not present

## 2022-12-05 DIAGNOSIS — I10 Essential (primary) hypertension: Secondary | ICD-10-CM | POA: Diagnosis not present

## 2022-12-05 DIAGNOSIS — G309 Alzheimer's disease, unspecified: Secondary | ICD-10-CM | POA: Diagnosis not present

## 2022-12-09 ENCOUNTER — Telehealth: Payer: Self-pay | Admitting: Cardiovascular Disease

## 2022-12-09 ENCOUNTER — Ambulatory Visit: Payer: HMO | Attending: Cardiovascular Disease | Admitting: Cardiovascular Disease

## 2022-12-09 ENCOUNTER — Encounter: Payer: Self-pay | Admitting: Cardiovascular Disease

## 2022-12-09 ENCOUNTER — Ambulatory Visit: Payer: HMO | Admitting: Cardiovascular Disease

## 2022-12-09 VITALS — BP 120/74 | HR 56 | Ht 69.5 in | Wt 191.6 lb

## 2022-12-09 DIAGNOSIS — R002 Palpitations: Secondary | ICD-10-CM | POA: Diagnosis not present

## 2022-12-09 DIAGNOSIS — I471 Supraventricular tachycardia, unspecified: Secondary | ICD-10-CM | POA: Diagnosis not present

## 2022-12-09 NOTE — H&P (View-Only) (Signed)
 Electrophysiology Office Note:    Date:  12/09/2022   ID:  [name], DOB [birth year], MRN [id number]  PCP:  [facility] @ [facility] Providers Cardiologist:  None     Referring MD: [facility]*   History of Present Illness:    [name] is a 82 y.o. male with a hx listed below, significant for HTN, ICH referred for arrhythmia management.  He presented to the ER in January 2024 with rapid palpitations and nausea.  He was found to be in SVT with a ventricular rate of 194 bpm.  He was placed on atenolol 25 mg and sent home with a 30-day monitor.  The monitor did not show any arrhythmia.  The patient's daughter, [name], and son-in-law, [name] (a psychiatrist), participated in the interview today by speaker phone.  I obtained a separate history from [place] since the patient has some dementia.  She notes that over the years, he has had sudden attacks of rapid palpitations with malaise, fatigue,  and chest and jaw pain.  This occurred in January while they were in the hospital seeing the patient's wife, who was admitted.  He was quickly taken to the ER where EKG showed SVT.  He has not had syncope during these episodes but does feel weak.  He drives to once in [place] daily to see his wife.         Current Medications:    Allergies:   Pollen extract   Social and Family History: Reviewed in Epic  ROS:   Please see the history of present illness.    All other systems reviewed and are negative.  EKGs/Labs/Other Studies Reviewed Today:    Echocardiogram:  TTE 01/29/2022 EF 55%.  Normal structure and function   Monitors: my interpretation   30 day [employer] Sinus rhythm  HR 42-102, avg 63 No arrhythmia detected  Stress testing:   Advanced imaging:   Cardiac catherization   EKG:  Last EKG results: Today -- normal sinus rhythm with nonspecific intraventricular conduction delay I reviewed all  electrocardiograms available in MUSE.  No evidence of preexcitation.  Recent Labs: 01/29/2022: ALT 22 05/25/2022: BUN 14; Creatinine, Ser 0.94; Hemoglobin 13.6; Magnesium 1.8; Platelets 207; Potassium 3.0; Sodium 134     Physical Exam:    VS:  BP 120/74 (BP Location: Left Arm, Patient Position: Sitting, Cuff Size: Large)   Pulse (!) 56   Ht 5' 9.5" (1.765 m)   Wt 191 lb 9.6 oz (86.9 kg)   SpO2 94%   BMI 27.89 kg/m     Wt Readings from Last 3 Encounters:  12/09/22 191 lb 9.6 oz (86.9 kg)  05/28/22 190 lb 3.2 oz (86.3 kg)  04/12/22 188 lb 6.4 oz (85.5 kg)     GEN:  Well nourished, well developed in no acute distress CARDIAC: RRR, no murmurs, rubs, gallops RESPIRATORY:  Normal work of breathing MUSCULOSKELETAL: no edema    ASSESSMENT & PLAN:    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  Concealed septal pathway cannot be excluded I explained the indication and rationale for electrophysiology study.  The patient daughter and son in law would like to proceed  We discussed the indication, rationale, logistics, anticipated benefits, and potential risks of the ablation procedure including but not limited to -- bleed at the groin access site; need for a temporary drainage tube, pacemaker, or prolonged hospitalization. I explained that the risk for stroke, heart attack, need for  open chest surgery, or even death is very low but not zero. he  expressed understanding and wishes to proceed.   Dementia Appears mild during our conversation today. There may be some memory defecits I would plan to start with light anesthesia in order to induce SVT, but we will be prepared to use deeper anesthesia or even general anesthesia if need be        Medication Adjustments/Labs and Tests Ordered: Current medicines are reviewed at length with the patient today.  Concerns regarding medicines are outlined above.  Orders Placed This Encounter  Procedures   EKG 12-Lead   No orders of the  defined types were placed in this encounter.    Signed, Maurice Small, MD  12/09/2022 11:48 AM    [facility]

## 2022-12-09 NOTE — Progress Notes (Signed)
Electrophysiology Office Note:    Date:  12/09/2022   ID:  [name], DOB 1940-07-12, MRN [id number]  PCP:  [facility] @ [facility] Providers Cardiologist:  None     Referring MD: [facility]*   History of Present Illness:    [name] is a 82 y.o. male with a hx listed below, significant for HTN, ICH referred for arrhythmia management.  He presented to the ER in January 2024 with rapid palpitations and nausea.  He was found to be in SVT with a ventricular rate of 194 bpm.  He was placed on atenolol 25 mg and sent home with a 30-day monitor.  The monitor did not show any arrhythmia.  The patient's daughter, [name], and son-in-law, [name] (a psychiatrist), participated in the interview today by speaker phone.  I obtained a separate history from [name] since the patient has some dementia.  She notes that over the years, he has had sudden attacks of rapid palpitations with malaise, fatigue,  and chest and jaw pain.  This occurred in January while they were in the hospital seeing the patient's wife, who was admitted.  He was quickly taken to the ER where EKG showed SVT.  He has not had syncope during these episodes but does feel weak.  He drives to once in [place] daily to see his wife.     Past Medical History:  Diagnosis Date   Essential hypertension, benign    Essential tremor    hands bilat more in right    H/O urinary frequency    with use of furosemide as stated per pt    Headache    hx of with ICH 2011   Hemorrhoids    Hyperlipidemia    Intracranial hemorrhage (HCC) 1/12   Acute right occipital hemorrhage with small right parietal subdural hematoma   Lower leg edema    bilat comes and goes has been prescribed furosemide to aide in relieve pt has currently not been using due to causing incontinence as stated per pt    MCI (mild cognitive impairment) 09/11/2020   Obstructive sleep apnea    not using CPAP machine     Osteoarthritis    Prostate cancer (HCC)    Radiation 09/06/13-11/06/13   prostate 78 gray   Tremor, essential 09/11/2020   Urinary incontinence    with use of furosemide as stated per pt    Vitamin B12 deficiency 09/15/2020    Past Surgical History:  Procedure Laterality Date   Arthroscopic right knee surgery     menicus tear     right knee 30 years ago was surgically repaired   TOTAL KNEE ARTHROPLASTY Right 10/21/2014   Procedure: TOTAL RIGHT KNEE ARTHROPLASTY;  Surgeon: Durene Romans, MD;  Location: [facility];  Service: Orthopedics;  Laterality: Right;    Current Medications: Current Meds  Medication Sig   amLODipine (NORVASC) 10 MG tablet Take 1 tablet (10 mg total) by mouth daily.   atenolol (TENORMIN) 25 MG tablet TAKE 1 TABLET BY MOUTH EVERY NIGHT AT BEDTIME   divalproex (DEPAKOTE) 125 MG DR tablet Take 125 mg by mouth 2 (two) times daily.   mirtazapine (REMERON) 15 MG tablet Take 15 mg by mouth at bedtime.   sertraline (ZOLOFT) 50 MG tablet Take 50 mg by mouth daily.   telmisartan-hydrochlorothiazide (MICARDIS HCT) 80-12.5 MG tablet    [DISCONTINUED] atorvastatin (LIPITOR) 20 MG tablet    [DISCONTINUED] fexofenadine (ALLEGRA) 180 MG tablet Take  180 mg by mouth daily.   [DISCONTINUED] potassium chloride (KLOR-CON) 10 MEQ tablet Take 1 tablet (10 mEq total) by mouth daily.     Allergies:   Pollen extract   Social and Family History: Reviewed in Epic  ROS:   Please see the history of present illness.    All other systems reviewed and are negative.  EKGs/Labs/Other Studies Reviewed Today:    Echocardiogram:  TTE 01/29/2022 EF 55%.  Normal structure and function   Monitors: my interpretation   30 day [employer] Sinus rhythm  HR 42-102, avg 63 No arrhythmia detected  Stress testing:   Advanced imaging:   Cardiac catherization   EKG:  Last EKG results: Today -- normal sinus rhythm with nonspecific intraventricular conduction delay I reviewed all  electrocardiograms available in MUSE.  No evidence of preexcitation.  Recent Labs: 01/29/2022: ALT 22 05/25/2022: BUN 14; Creatinine, Ser 0.94; Hemoglobin 13.6; Magnesium 1.8; Platelets 207; Potassium 3.0; Sodium 134     Physical Exam:    VS:  BP 120/74 (BP Location: Left Arm, Patient Position: Sitting, Cuff Size: Large)   Pulse (!) 56   Ht 5' 9.5" (1.765 m)   Wt 191 lb 9.6 oz (86.9 kg)   SpO2 94%   BMI 27.89 kg/m     Wt Readings from Last 3 Encounters:  12/09/22 191 lb 9.6 oz (86.9 kg)  05/28/22 190 lb 3.2 oz (86.3 kg)  04/12/22 188 lb 6.4 oz (85.5 kg)     GEN:  Well nourished, well developed in no acute distress CARDIAC: RRR, no murmurs, rubs, gallops RESPIRATORY:  Normal work of breathing MUSCULOSKELETAL: no edema    ASSESSMENT & PLAN:    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  Concealed septal pathway cannot be excluded I explained the indication and rationale for electrophysiology study.  The patient daughter and son in law would like to proceed  We discussed the indication, rationale, logistics, anticipated benefits, and potential risks of the ablation procedure including but not limited to -- bleed at the groin access site; need for a temporary drainage tube, pacemaker, or prolonged hospitalization. I explained that the risk for stroke, heart attack, need for open chest surgery, or even death is very low but not zero. he  expressed understanding and wishes to proceed.   Dementia Appears mild during our conversation today. There may be some memory defecits I would plan to start with light anesthesia in order to induce SVT, but we will be prepared to use deeper anesthesia or even general anesthesia if need be        Medication Adjustments/Labs and Tests Ordered: Current medicines are reviewed at length with the patient today.  Concerns regarding medicines are outlined above.  Orders Placed This Encounter  Procedures   EKG 12-Lead   No orders of the  defined types were placed in this encounter.    Signed, Maurice Small, MD  12/09/2022 11:51 AM    [facility]

## 2022-12-09 NOTE — Telephone Encounter (Signed)
Dr. Mosetta Pigeon calling about appt and wanting to know if he can still get a call about pt's appt.

## 2022-12-09 NOTE — Telephone Encounter (Signed)
Dr Nelly Laurence spoke with family during patient visit today (Dr. Mosetta Pigeon), all questions answered

## 2022-12-09 NOTE — Telephone Encounter (Signed)
Daughter is calling to see what time the patient is suppose to be at hospital for procedure. And how long do the procedure last. Please advise

## 2022-12-09 NOTE — Patient Instructions (Signed)
Medication Instructions:  Your physician recommends that you continue on your current medications as directed. Please refer to the Current Medication list given to you today. *If you need a refill on your cardiac medications before your next appointment, please call your pharmacy*   Lab Work: CBC and BMET today If you have labs (blood work) drawn today and your tests are completely normal, you will receive your results only by: MyChart Message (if you have MyChart) OR A paper copy in the mail If you have any lab test that is abnormal or we need to change your treatment, we will call you to review the results.  Testing/Procedures: SVT ablation - see instruction letter Your physician has recommended that you have an ablation. Catheter ablation is a medical procedure used to treat some cardiac arrhythmias (irregular heartbeats). During catheter ablation, a long, thin, flexible tube is put into a blood vessel in your groin (upper thigh), or neck. This tube is called an ablation catheter. It is then guided to your heart through the blood vessel. Radio frequency waves destroy small areas of heart tissue where abnormal heartbeats may cause an arrhythmia to start. Please see the instruction sheet given to you today.  Follow-Up: At [facility], you and your health needs are our priority.  As part of our continuing mission to provide you with exceptional heart care, we have created designated Provider Care Teams.  These Care Teams include your primary Cardiologist (physician) and Advanced Practice Providers (APPs -  Physician Assistants and Nurse Practitioners) who all work together to provide you with the care you need, when you need it.  We recommend signing up for the patient portal called "MyChart".  Sign up information is provided on this After Visit Summary.  MyChart is used to connect with patients for Virtual Visits (Telemedicine).  Patients are able to view lab/test results, encounter  notes, upcoming appointments, etc.  Non-urgent messages can be sent to your provider as well.   To learn more about what you can do with MyChart, go to [email or web address].    Your next appointment:   We will schedule follow up after ablation   Provider:   York Pellant, MD

## 2022-12-09 NOTE — Progress Notes (Signed)
Electrophysiology Office Note:    Date:  12/09/2022   ID:  [name], DOB [birth year], MRN [id number]  PCP:  [facility] Providers Cardiologist:  None     Referring MD: [facility]   History of Present Illness:    [name] is a 82 y.o. male with a hx listed below, significant for HTN, ICH referred for arrhythmia management.  He presented to the ER in January 2024 with rapid palpitations and nausea.  He was found to be in SVT with a ventricular rate of 194 bpm.  He was placed on atenolol 25 mg and sent home with a 30-day monitor.  The monitor did not show any arrhythmia.  The patient's daughter, [name], and son-in-law, [name] (a psychiatrist), participated in the interview today by speaker phone.  I obtained a separate history from [place] since the patient has some dementia.  She notes that over the years, he has had sudden attacks of rapid palpitations with malaise, fatigue,  and chest and jaw pain.  This occurred in January while they were in the hospital seeing the patient's wife, who was admitted.  He was quickly taken to the ER where EKG showed SVT.  He has not had syncope during these episodes but does feel weak.  He drives to once in [place] daily to see his wife.         Current Medications:    Allergies:   Pollen extract   Social and Family History: Reviewed in Epic  ROS:   Please see the history of present illness.    All other systems reviewed and are negative.  EKGs/Labs/Other Studies Reviewed Today:    Echocardiogram:  TTE 01/29/2022 EF 55%.  Normal structure and function   Monitors: my interpretation   30 day Preventice Sinus rhythm  HR 42-102, avg 63 No arrhythmia detected  Stress testing:   Advanced imaging:   Cardiac catherization   EKG:  Last EKG results: Today -- normal sinus rhythm with nonspecific intraventricular conduction delay I reviewed all  electrocardiograms available in MUSE.  No evidence of preexcitation.  Recent Labs: 01/29/2022: ALT 22 05/25/2022: BUN 14; Creatinine, Ser 0.94; Hemoglobin 13.6; Magnesium 1.8; Platelets 207; Potassium 3.0; Sodium 134     Physical Exam:    VS:  BP 120/74 (BP Location: Left Arm, Patient Position: Sitting, Cuff Size: Large)   Pulse (!) 56   Ht 5' 9.5" (1.765 m)   Wt 191 lb 9.6 oz (86.9 kg)   SpO2 94%   BMI 27.89 kg/m     Wt Readings from Last 3 Encounters:  12/09/22 191 lb 9.6 oz (86.9 kg)  05/28/22 190 lb 3.2 oz (86.3 kg)  04/12/22 188 lb 6.4 oz (85.5 kg)     GEN:  Well nourished, well developed in no acute distress CARDIAC: RRR, no murmurs, rubs, gallops RESPIRATORY:  Normal work of breathing MUSCULOSKELETAL: no edema    ASSESSMENT & PLAN:    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  Concealed septal pathway cannot be excluded I explained the indication and rationale for electrophysiology study.  The patient daughter and son in law would like to proceed  We discussed the indication, rationale, logistics, anticipated benefits, and potential risks of the ablation procedure including but not limited to -- bleed at the groin access site; need for a temporary drainage tube, pacemaker, or prolonged hospitalization. I explained that the risk for stroke, heart attack, need for  open chest surgery, or even death is very low but not zero. he  expressed understanding and wishes to proceed.   Dementia Appears mild during our conversation today. There may be some memory defecits I would plan to start with light anesthesia in order to induce SVT, but we will be prepared to use deeper anesthesia or even general anesthesia if need be        Medication Adjustments/Labs and Tests Ordered: Current medicines are reviewed at length with the patient today.  Concerns regarding medicines are outlined above.  Orders Placed This Encounter  Procedures   EKG 12-Lead   No orders of the  defined types were placed in this encounter.    Signed, Maurice Small, MD  12/09/2022 11:48 AM    [facility]

## 2022-12-10 LAB — BASIC METABOLIC PANEL
BUN/Creatinine Ratio: 19 (ref 10–24)
BUN: 15 mg/dL (ref 8–27)
CO2: 28 mmol/L (ref 20–29)
Calcium: 9.1 mg/dL (ref 8.6–10.2)
Chloride: 100 mmol/L (ref 96–106)
Creatinine, Ser: 0.78 mg/dL (ref 0.76–1.27)
Glucose: 107 mg/dL — ABNORMAL HIGH (ref 70–99)
Potassium: 4.4 mmol/L (ref 3.5–5.2)
Sodium: 143 mmol/L (ref 134–144)
eGFR: 90 mL/min/{1.73_m2} (ref >59)

## 2022-12-10 LAB — CBC
Hematocrit: 42.9 % (ref 37.5–51.0)
Hemoglobin: 14.3 g/dL (ref 13.0–17.7)
MCH: 32.6 pg (ref 26.6–33.0)
MCHC: 33.3 g/dL (ref 31.5–35.7)
MCV: 98 fL — ABNORMAL HIGH (ref 79–97)
Platelets: 190 10*3/uL (ref 150–450)
RBC: 4.38 x10E6/uL (ref 4.14–5.80)
RDW: 12.4 % (ref 11.6–15.4)
WBC: 8.7 10*3/uL (ref 3.4–10.8)

## 2022-12-10 NOTE — Telephone Encounter (Signed)
I spoke with pt's Daughter and informed her of the time he needs to arrive at the hospital for his procedure.   I also let her know that I have faxed a copy of his Instruction letter to the facility to Attn: [facility]

## 2022-12-13 ENCOUNTER — Telehealth: Payer: Self-pay | Admitting: Cardiovascular Disease

## 2022-12-13 NOTE — Telephone Encounter (Signed)
I spoke with pt's daughter and she wanted to know if they would need to pay anything the day of the procedure. I informed her that I know nothing about the billing side of the procedures. She stated that her Dad has Medicaid pending so I told her to make them aware of that when he checked in at the hospital the day of his procedure.

## 2022-12-13 NOTE — Telephone Encounter (Signed)
Daughter Jill Side) wants a call back from [facility] directly.

## 2022-12-14 DIAGNOSIS — H6123 Impacted cerumen, bilateral: Secondary | ICD-10-CM | POA: Diagnosis not present

## 2022-12-14 DIAGNOSIS — H900 Conductive hearing loss, bilateral: Secondary | ICD-10-CM | POA: Diagnosis not present

## 2022-12-15 NOTE — Pre-Procedure Instructions (Signed)
Instructed patient on the following items: Arrival time 0800-0830 Nothing to eat or drink after midnight No meds AM of procedure Responsible person to drive you home and stay with you for 24 hrs

## 2022-12-17 ENCOUNTER — Other Ambulatory Visit: Payer: Self-pay

## 2022-12-17 ENCOUNTER — Ambulatory Visit (HOSPITAL_COMMUNITY): Payer: HMO | Admitting: Certified Registered Nurse Anesthetist

## 2022-12-17 ENCOUNTER — Ambulatory Visit (HOSPITAL_COMMUNITY)
Admission: RE | Admit: 2022-12-17 | Discharge: 2022-12-17 | Disposition: A | Discharge status: Home / Self Care | Payer: HMO | Attending: Cardiovascular Disease | Admitting: Cardiovascular Disease

## 2022-12-17 ENCOUNTER — Encounter (HOSPITAL_COMMUNITY)
Admission: RE | Disposition: A | Discharge status: Home / Self Care | Payer: Self-pay | Source: Home / Self Care | Attending: Cardiovascular Disease

## 2022-12-17 DIAGNOSIS — I471 Supraventricular tachycardia, unspecified: Secondary | ICD-10-CM | POA: Diagnosis not present

## 2022-12-17 DIAGNOSIS — I4719 Other supraventricular tachycardia: Secondary | ICD-10-CM

## 2022-12-17 DIAGNOSIS — Z87891 Personal history of nicotine dependence: Secondary | ICD-10-CM | POA: Diagnosis not present

## 2022-12-17 DIAGNOSIS — G4733 Obstructive sleep apnea (adult) (pediatric): Secondary | ICD-10-CM | POA: Diagnosis not present

## 2022-12-17 DIAGNOSIS — F0390 Unspecified dementia without behavioral disturbance: Secondary | ICD-10-CM | POA: Insufficient documentation

## 2022-12-17 DIAGNOSIS — I10 Essential (primary) hypertension: Secondary | ICD-10-CM | POA: Insufficient documentation

## 2022-12-17 HISTORY — PX: SVT ABLATION: EP1225

## 2022-12-17 SURGERY — SVT ABLATION
Anesthesia: Monitor Anesthesia Care

## 2022-12-17 MED ORDER — ISOPROTERENOL HCL 0.2 MG/ML IJ SOLN
INTRAVENOUS | Status: DC | PRN
  Administered 2022-12-17: 2 ug/min via INTRAVENOUS

## 2022-12-17 MED ORDER — PROPOFOL 500 MG/50ML IV EMUL
INTRAVENOUS | Status: DC | PRN
  Administered 2022-12-17: 10 ug/kg/min via INTRAVENOUS

## 2022-12-17 MED ORDER — BUPIVACAINE HCL (PF) 0.25 % IJ SOLN
INTRAMUSCULAR | Status: DC | PRN
  Administered 2022-12-17: 30 mL

## 2022-12-17 MED ORDER — ONDANSETRON HCL 4 MG/2ML IJ SOLN
INTRAMUSCULAR | Status: DC | PRN
  Administered 2022-12-17: 4 mg via INTRAVENOUS

## 2022-12-17 MED ORDER — FENTANYL CITRATE (PF) 100 MCG/2ML IJ SOLN
INTRAMUSCULAR | Status: AC
  Filled 2022-12-17: qty 2

## 2022-12-17 MED ORDER — ATENOLOL 25 MG PO TABS
25.0000 mg | ORAL_TABLET | Freq: Every day | ORAL | Status: DC
  Administered 2022-12-17: 25 mg via ORAL
  Filled 2022-12-17: qty 1

## 2022-12-17 MED ORDER — ACETAMINOPHEN 325 MG PO TABS
650.0000 mg | ORAL_TABLET | ORAL | Status: DC | PRN
Start: 2022-12-17 — End: 2022-12-17

## 2022-12-17 MED ORDER — HEPARIN (PORCINE) IN NACL 1000-0.9 UT/500ML-% IV SOLN
INTRAVENOUS | Status: DC | PRN
  Administered 2022-12-17: 500 mL

## 2022-12-17 MED ORDER — BUPIVACAINE HCL (PF) 0.25 % IJ SOLN
INTRAMUSCULAR | Status: AC
  Filled 2022-12-17: qty 30

## 2022-12-17 MED ORDER — SODIUM CHLORIDE 0.9 % IV SOLN: INTRAVENOUS | Status: DC

## 2022-12-17 MED ORDER — ISOPROTERENOL HCL 0.2 MG/ML IJ SOLN
INTRAMUSCULAR | Status: AC
  Filled 2022-12-17: qty 5

## 2022-12-17 MED ORDER — ONDANSETRON HCL 4 MG/2ML IJ SOLN: 4.0000 mg | Freq: Four times a day (QID) | INTRAMUSCULAR | Status: DC | PRN

## 2022-12-17 MED ORDER — SODIUM CHLORIDE 0.9% FLUSH: 3.0000 mL | INTRAVENOUS | Status: DC | PRN

## 2022-12-17 MED ORDER — FENTANYL CITRATE (PF) 100 MCG/2ML IJ SOLN
INTRAMUSCULAR | Status: DC | PRN
  Administered 2022-12-17: 12.5 ug via INTRAVENOUS
  Administered 2022-12-17 (×3): 25 ug via INTRAVENOUS
  Administered 2022-12-17: 12.5 ug via INTRAVENOUS

## 2022-12-17 MED ORDER — SODIUM CHLORIDE 0.9% FLUSH: 3.0000 mL | Freq: Two times a day (BID) | INTRAVENOUS | Status: DC

## 2022-12-17 MED ORDER — PHENYLEPHRINE 80 MCG/ML (10ML) SYRINGE FOR IV PUSH (FOR BLOOD PRESSURE SUPPORT)
PREFILLED_SYRINGE | INTRAVENOUS | Status: DC | PRN
  Administered 2022-12-17 (×2): 120 ug via INTRAVENOUS

## 2022-12-17 MED ORDER — SODIUM CHLORIDE 0.9 % IV SOLN: 250.0000 mL | INTRAVENOUS | Status: DC | PRN

## 2022-12-17 SURGICAL SUPPLY — 11 items
BAG SNAP BAND KOVER 36X36 (MISCELLANEOUS) IMPLANT
CATH DECANAV F CURVE (CATHETERS) IMPLANT
CATH EZ STEER NAV 4MM F-J CUR (ABLATOR) IMPLANT
CATH JOSEPH QUAD ALLRED 6F REP (CATHETERS) IMPLANT
DEVICE CLOSURE MYNXGRIP 6/7F (Vascular Products) IMPLANT
PACK EP LF (CUSTOM PROCEDURE TRAY) ×1 IMPLANT
PAD DEFIB RADIO PHYSIO CONN (PAD) ×1 IMPLANT
PATCH CARTO3 (PAD) IMPLANT
SHEATH PINNACLE 6F 10CM (SHEATH) IMPLANT
SHEATH PINNACLE 8F 10CM (SHEATH) IMPLANT
SHEATH PROBE COVER 6X72 (BAG) IMPLANT

## 2022-12-17 NOTE — Progress Notes (Signed)
Client found up in room and refuses to go back to bed; client took monitor leads off, put clothes on and went to bathroom; client states does not know where he is at and reoriented to place; son in

## 2022-12-17 NOTE — Discharge Instructions (Signed)

## 2022-12-17 NOTE — Anesthesia Preprocedure Evaluation (Addendum)
Anesthesia Evaluation  Patient identified by MRN, date of birth, ID band Patient awake    Reviewed: Allergy & Precautions, NPO status , Patient's Chart, lab work & pertinent test results, reviewed documented beta blocker date and time   History of Anesthesia Complications Negative for: history of anesthetic complications  Airway Mallampati: II  TM Distance: >3 FB Neck ROM: Limited    Dental no notable dental hx. (+) Poor Dentition,    Pulmonary neg shortness of breath, asthma , sleep apnea , neg COPD, former smoker   breath sounds clear to auscultation       Cardiovascular hypertension, (-) angina + Peripheral Vascular Disease  (-) CAD + dysrhythmias Supra Ventricular Tachycardia  Rhythm:Regular Rate:Normal     Neuro/Psych  Headaches, neg Seizures    GI/Hepatic ,neg GERD  ,,(+) neg Cirrhosis        Endo/Other  neg diabetes    Renal/GU Renal disease     Musculoskeletal  (+) Arthritis ,    Abdominal   Peds  Hematology   Anesthesia Other Findings   Reproductive/Obstetrics                             Anesthesia Physical Anesthesia Plan  ASA: 3  Anesthesia Plan: MAC   Post-op Pain Management:    Induction: Intravenous  PONV Risk Score and Plan: 2 and Ondansetron and Propofol infusion  Airway Management Planned:   Additional Equipment:   Intra-op Plan:   Post-operative Plan:   Informed Consent: I have reviewed the patients History and Physical, chart, labs and discussed the procedure including the risks, benefits and alternatives for the proposed anesthesia with the patient or authorized representative who has indicated his/her understanding and acceptance.     Dental advisory given and Consent reviewed with POA  Plan Discussed with: CRNA  Anesthesia Plan Comments:        Anesthesia Quick Evaluation

## 2022-12-17 NOTE — Transfer of Care (Signed)
Immediate Anesthesia Transfer of Care Note  Patient: [name]  Procedure(s) Performed: SVT ABLATION  Patient Location: PACU  Anesthesia Type:MAC  Level of Consciousness: awake, alert , and oriented  Airway & Oxygen Therapy: Patient Spontanous Breathing  Post-op Assessment: Report given to RN and Post -op Vital signs reviewed and stable  Post vital signs: Reviewed and stable  Last Vitals:  Vitals Value Taken Time  BP 131/73 12/17/22 1148  Temp    Pulse 82 12/17/22 1151  Resp 12 12/17/22 1151  SpO2 92 % 12/17/22 1151  Vitals shown include unfiled device data.  Last Pain:  Vitals:   12/17/22 0826  TempSrc: Oral         Complications: There were no known notable events for this encounter.

## 2022-12-17 NOTE — Progress Notes (Signed)
Mealor, MD paged in regards to patient refusing to lay in bed and stating he is leaving. MD approved to shorten bedrest to 2.5 hours. Patient's RN, Shawna Orleans informed.

## 2022-12-17 NOTE — Interval H&P Note (Signed)
History and Physical Interval Note:  12/17/2022 8:12 AM  [name]  has presented today for surgery, with the diagnosis of svt.  The various methods of treatment have been discussed with the patient and family. After consideration of risks, benefits and other options for treatment, the patient has consented to  Procedure(s): SVT ABLATION (N/A) as a surgical intervention.  The patient's history has been reviewed, patient examined, no change in status, stable for surgery.  I have reviewed the patient's chart and labs.  Questions were answered to the patient's satisfaction.     Roberts Gaudy Aviya Jarvie

## 2022-12-20 ENCOUNTER — Encounter (HOSPITAL_COMMUNITY): Payer: Self-pay | Admitting: Cardiovascular Disease

## 2022-12-27 DIAGNOSIS — F0280 Dementia in other diseases classified elsewhere without behavioral disturbance: Secondary | ICD-10-CM | POA: Diagnosis not present

## 2022-12-27 DIAGNOSIS — G309 Alzheimer's disease, unspecified: Secondary | ICD-10-CM | POA: Diagnosis not present

## 2022-12-27 DIAGNOSIS — R1310 Dysphagia, unspecified: Secondary | ICD-10-CM | POA: Diagnosis not present

## 2022-12-27 DIAGNOSIS — G250 Essential tremor: Secondary | ICD-10-CM | POA: Diagnosis not present

## 2022-12-28 NOTE — Anesthesia Postprocedure Evaluation (Signed)
Anesthesia Post Note  Patient: [name]  Procedure(s) Performed: SVT ABLATION     Patient location during evaluation: PACU Anesthesia Type: MAC Level of consciousness: awake Pain management: pain level controlled Vital Signs Assessment: post-procedure vital signs reviewed and stable Respiratory status: spontaneous breathing Cardiovascular status: stable Postop Assessment: no apparent nausea or vomiting Anesthetic complications: no  There were no known notable events for this encounter.  Last Vitals:  Vitals:   12/17/22 1330 12/17/22 1400  BP: 119/79 127/79  Pulse: 84 76  Resp: 17 14  Temp:    SpO2: 94% 95%    Last Pain:  Vitals:   12/20/22 1405  TempSrc:   PainSc: 0-No pain                 Caren Macadam

## 2023-01-16 DIAGNOSIS — F0280 Dementia in other diseases classified elsewhere without behavioral disturbance: Secondary | ICD-10-CM | POA: Diagnosis not present

## 2023-01-16 DIAGNOSIS — F3181 Bipolar II disorder: Secondary | ICD-10-CM | POA: Diagnosis not present

## 2023-01-16 DIAGNOSIS — G309 Alzheimer's disease, unspecified: Secondary | ICD-10-CM | POA: Diagnosis not present

## 2023-01-16 DIAGNOSIS — I10 Essential (primary) hypertension: Secondary | ICD-10-CM | POA: Diagnosis not present

## 2023-01-18 DIAGNOSIS — G309 Alzheimer's disease, unspecified: Secondary | ICD-10-CM | POA: Diagnosis not present

## 2023-01-19 DIAGNOSIS — G309 Alzheimer's disease, unspecified: Secondary | ICD-10-CM | POA: Diagnosis not present

## 2023-01-20 ENCOUNTER — Ambulatory Visit: Payer: HMO | Admitting: Cardiovascular Disease

## 2023-01-27 NOTE — Progress Notes (Deleted)
  Electrophysiology Office Note:   Date:  01/27/2023  ID:  Kaleo Condrey, DOB 1940-07-21, MRN [id number]  Primary Cardiologist: None Electrophysiologist: Eulas FORBES Furbish, MD  {Click to update primary MD,subspecialty MD or APP then REFRESH:1}    History of Present Illness:   Phinehas Grounds is a 83 y.o. male with h/o HTN, ICH, and SVT seen today for post hospital follow up.    S/p Ablation 12/17/2022 as below.   Since discharge from hospital the patient reports doing ***.  he denies chest pain, palpitations, dyspnea, PND, orthopnea, nausea, vomiting, dizziness, syncope, edema, weight gain, or early satiety.   Review of systems complete and found to be negative unless listed in HPI.   EP Information / Studies Reviewed:    EKG is ordered today. Personal review as below.       Echo 01/29/2022 EF 55%.  Normal structure and function  Arrhythmia History  S/p SVT (AVNRT) ablation 12/17/2022 without conclusive resolution  Physical Exam:   VS:  There were no vitals taken for this visit.   Wt Readings from Last 3 Encounters:  12/17/22 210 lb (95.3 kg)  12/09/22 191 lb 9.6 oz (86.9 kg)  05/28/22 190 lb 3.2 oz (86.3 kg)     GEN: No acute distress NECK: No JVD; No carotid bruits CARDIAC: {EPRHYTHM:28826}, no murmurs, rubs, gallops RESPIRATORY:  Clear to auscultation without rales, wheezing or rhonchi  ABDOMEN: Soft, non-tender, non-distended EXTREMITIES:  {EDEMA LEVEL:28147::No} edema; No deformity   ASSESSMENT AND PLAN:    SVT S/p ablation 11/2022 that had to be stopped due to proximity to the HIS.  EKG today shows *** Continue atenolol  25 mg daily   {Click here to Review PMH, Prob List, Meds, Allergies, SHx, FHx  :1}   Follow up with {ZEFID:71864} {EPFOLLOW LE:71826}  Signed, Ozell Prentice Passey, PA-C

## 2023-01-28 ENCOUNTER — Ambulatory Visit: Payer: HMO | Admitting: Student

## 2023-01-31 DIAGNOSIS — G309 Alzheimer's disease, unspecified: Secondary | ICD-10-CM | POA: Diagnosis not present

## 2023-02-03 NOTE — Progress Notes (Signed)
  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    S/p Ablation 12/17/2022 as below. Reports no issues but does not remember having the procedure done.   Since discharge from hospital the patient reports he is doing well. He "feels fine".  He is alone at today's visit.  He denies chest pain, palpitations, dyspnea, PND, orthopnea, nausea, vomiting, dizziness, syncope, edema, weight gain, or early satiety.   Review of systems complete and found to be negative unless listed in HPI.   EP Information / Studies Reviewed:    EKG is ordered today. Personal review as below.  EKG Interpretation Date/Time:  Friday February 04 2023 08:38:52 EST Ventricular Rate:  68 PR Interval:  188 QRS Duration:  122 QT Interval:  416 QTC Calculation: 442 R Axis:   -65  Text Interpretation: Normal sinus rhythm Left anterior fascicular block Confirmed by Canary Brim ([id number]) on 02/04/2023 8:51:36 AM    Studies:  ECHO 01/29/2022 > EF 55%.  Normal structure and function   Arrhythmia / AAD: S/p SVT (AVNRT) ablation 12/17/2022 without conclusive resolution  Physical Exam:   VS:  BP (!) 142/76   Pulse 68   Ht 5\' 10"  (1.778 m)   Wt 195 lb 3.2 oz (88.5 kg)   SpO2 94%   BMI 28.01 kg/m    Wt Readings from Last 3 Encounters:  02/04/23 195 lb 3.2 oz (88.5 kg)  12/17/22 210 lb (95.3 kg)  12/09/22 191 lb 9.6 oz (86.9 kg)     GEN: elderly male sitting in chair in no acute distress NECK: No JVD; No carotid bruits CARDIAC: Regular rate and rhythm, no murmurs, rubs, gallops RESPIRATORY:  Clear to auscultation without rales, wheezing or rhonchi  ABDOMEN: Soft, non-tender,  non-distended EXTREMITIES:  No edema; No deformity   ASSESSMENT AND PLAN:    SVT S/p ablation 11/2022 that had to be stopped due to proximity to the HIS.  -EKG today shows NSR  -Continue atenolol 25 mg daily  -no recurrent symptoms    Dementia  -per primary   Follow up with Dr. Nelly Laurence or EP APP in 6 months  Signed, Canary Brim, NP-C, AGACNP-BC [facility] - Electrophysiology  02/04/2023, 9:05 AM

## 2023-02-04 ENCOUNTER — Ambulatory Visit: Payer: HMO | Attending: Pulmonary Disease | Admitting: Pulmonary Disease

## 2023-02-04 ENCOUNTER — Encounter: Payer: Self-pay | Admitting: Pulmonary Disease

## 2023-02-04 VITALS — BP 142/76 | HR 68 | Ht 70.0 in | Wt 195.2 lb

## 2023-02-04 DIAGNOSIS — I471 Supraventricular tachycardia, unspecified: Secondary | ICD-10-CM | POA: Diagnosis not present

## 2023-02-04 DIAGNOSIS — R002 Palpitations: Secondary | ICD-10-CM

## 2023-02-04 NOTE — Patient Instructions (Signed)
Medication Instructions:  Your physician recommends that you continue on your current medications as directed. Please refer to the Current Medication list given to you today.  *If you need a refill on your cardiac medications before your next appointment, please call your pharmacy*  Lab Work: None ordered If you have labs (blood work) drawn today and your tests are completely normal, you will receive your results only by: MyChart Message (if you have MyChart) OR A paper copy in the mail If you have any lab test that is abnormal or we need to change your treatment, we will call you to review the results.  Follow-Up: At [facility], you and your health needs are our priority.  As part of our continuing mission to provide you with exceptional heart care, we have created designated Provider Care Teams.  These Care Teams include your primary Cardiologist (physician) and Advanced Practice Providers (APPs -  Physician Assistants and Nurse Practitioners) who all work together to provide you with the care you need, when you need it.  Your next appointment:   6 month(s)  Provider:   Augustus Mealor, MD  

## 2023-02-09 DIAGNOSIS — G309 Alzheimer's disease, unspecified: Secondary | ICD-10-CM | POA: Diagnosis not present

## 2023-03-10 DIAGNOSIS — E119 Type 2 diabetes mellitus without complications: Secondary | ICD-10-CM | POA: Diagnosis not present

## 2023-03-10 DIAGNOSIS — R7989 Other specified abnormal findings of blood chemistry: Secondary | ICD-10-CM | POA: Diagnosis not present

## 2023-03-21 DIAGNOSIS — I10 Essential (primary) hypertension: Secondary | ICD-10-CM | POA: Diagnosis not present

## 2023-03-21 DIAGNOSIS — C61 Malignant neoplasm of prostate: Secondary | ICD-10-CM | POA: Diagnosis not present

## 2023-03-21 DIAGNOSIS — G309 Alzheimer's disease, unspecified: Secondary | ICD-10-CM | POA: Diagnosis not present

## 2023-03-21 DIAGNOSIS — H900 Conductive hearing loss, bilateral: Secondary | ICD-10-CM | POA: Diagnosis not present

## 2023-03-25 DIAGNOSIS — C61 Malignant neoplasm of prostate: Secondary | ICD-10-CM | POA: Diagnosis not present

## 2023-03-25 DIAGNOSIS — Z1330 Encounter for screening examination for mental health and behavioral disorders, unspecified: Secondary | ICD-10-CM | POA: Diagnosis not present

## 2023-03-25 DIAGNOSIS — R32 Unspecified urinary incontinence: Secondary | ICD-10-CM | POA: Diagnosis not present

## 2023-04-15 DIAGNOSIS — F0284 Dementia in other diseases classified elsewhere, unspecified severity, with anxiety: Secondary | ICD-10-CM | POA: Diagnosis not present

## 2023-04-15 DIAGNOSIS — G309 Alzheimer's disease, unspecified: Secondary | ICD-10-CM | POA: Diagnosis not present

## 2023-04-19 DIAGNOSIS — C61 Malignant neoplasm of prostate: Secondary | ICD-10-CM | POA: Diagnosis not present

## 2023-04-19 DIAGNOSIS — G309 Alzheimer's disease, unspecified: Secondary | ICD-10-CM | POA: Diagnosis not present

## 2023-04-19 DIAGNOSIS — H900 Conductive hearing loss, bilateral: Secondary | ICD-10-CM | POA: Diagnosis not present

## 2023-04-19 DIAGNOSIS — I10 Essential (primary) hypertension: Secondary | ICD-10-CM | POA: Diagnosis not present

## 2023-04-28 DIAGNOSIS — R6520 Severe sepsis without septic shock: Secondary | ICD-10-CM | POA: Diagnosis not present

## 2023-04-28 DIAGNOSIS — Z8546 Personal history of malignant neoplasm of prostate: Secondary | ICD-10-CM | POA: Diagnosis not present

## 2023-04-28 DIAGNOSIS — R509 Fever, unspecified: Secondary | ICD-10-CM | POA: Diagnosis not present

## 2023-04-28 DIAGNOSIS — F0393 Unspecified dementia, unspecified severity, with mood disturbance: Secondary | ICD-10-CM | POA: Diagnosis not present

## 2023-04-28 DIAGNOSIS — Z820 Family history of epilepsy and other diseases of the nervous system: Secondary | ICD-10-CM | POA: Diagnosis not present

## 2023-04-28 DIAGNOSIS — L409 Psoriasis, unspecified: Secondary | ICD-10-CM | POA: Diagnosis not present

## 2023-04-28 DIAGNOSIS — J9601 Acute respiratory failure with hypoxia: Secondary | ICD-10-CM | POA: Diagnosis not present

## 2023-04-28 DIAGNOSIS — A419 Sepsis, unspecified organism: Secondary | ICD-10-CM | POA: Diagnosis not present

## 2023-04-28 DIAGNOSIS — I739 Peripheral vascular disease, unspecified: Secondary | ICD-10-CM | POA: Diagnosis not present

## 2023-04-28 DIAGNOSIS — R5600 Simple febrile convulsions: Secondary | ICD-10-CM | POA: Diagnosis not present

## 2023-04-28 DIAGNOSIS — R0902 Hypoxemia: Secondary | ICD-10-CM | POA: Diagnosis not present

## 2023-04-28 DIAGNOSIS — F40240 Claustrophobia: Secondary | ICD-10-CM | POA: Diagnosis not present

## 2023-04-28 DIAGNOSIS — E785 Hyperlipidemia, unspecified: Secondary | ICD-10-CM | POA: Diagnosis not present

## 2023-04-28 DIAGNOSIS — Z8673 Personal history of transient ischemic attack (TIA), and cerebral infarction without residual deficits: Secondary | ICD-10-CM | POA: Diagnosis not present

## 2023-04-28 DIAGNOSIS — R21 Rash and other nonspecific skin eruption: Secondary | ICD-10-CM | POA: Diagnosis not present

## 2023-04-28 DIAGNOSIS — G250 Essential tremor: Secondary | ICD-10-CM | POA: Diagnosis not present

## 2023-04-28 DIAGNOSIS — L03116 Cellulitis of left lower limb: Secondary | ICD-10-CM | POA: Diagnosis not present

## 2023-04-28 DIAGNOSIS — F32A Depression, unspecified: Secondary | ICD-10-CM | POA: Diagnosis not present

## 2023-04-28 DIAGNOSIS — B356 Tinea cruris: Secondary | ICD-10-CM | POA: Diagnosis not present

## 2023-04-28 DIAGNOSIS — I10 Essential (primary) hypertension: Secondary | ICD-10-CM | POA: Diagnosis not present

## 2023-04-28 DIAGNOSIS — Z634 Disappearance and death of family member: Secondary | ICD-10-CM | POA: Diagnosis not present

## 2023-04-28 DIAGNOSIS — Z79899 Other long term (current) drug therapy: Secondary | ICD-10-CM | POA: Diagnosis not present

## 2023-04-28 DIAGNOSIS — F419 Anxiety disorder, unspecified: Secondary | ICD-10-CM | POA: Diagnosis not present

## 2023-05-03 DIAGNOSIS — H900 Conductive hearing loss, bilateral: Secondary | ICD-10-CM | POA: Diagnosis not present

## 2023-05-03 DIAGNOSIS — I10 Essential (primary) hypertension: Secondary | ICD-10-CM | POA: Diagnosis not present

## 2023-05-03 DIAGNOSIS — G309 Alzheimer's disease, unspecified: Secondary | ICD-10-CM | POA: Diagnosis not present

## 2023-05-03 DIAGNOSIS — C61 Malignant neoplasm of prostate: Secondary | ICD-10-CM | POA: Diagnosis not present

## 2023-05-09 DIAGNOSIS — G309 Alzheimer's disease, unspecified: Secondary | ICD-10-CM | POA: Diagnosis not present

## 2023-05-09 DIAGNOSIS — G250 Essential tremor: Secondary | ICD-10-CM | POA: Diagnosis not present

## 2023-05-09 DIAGNOSIS — F3189 Other bipolar disorder: Secondary | ICD-10-CM | POA: Diagnosis not present

## 2023-05-19 DIAGNOSIS — I10 Essential (primary) hypertension: Secondary | ICD-10-CM | POA: Diagnosis not present

## 2023-05-19 DIAGNOSIS — G309 Alzheimer's disease, unspecified: Secondary | ICD-10-CM | POA: Diagnosis not present

## 2023-05-19 DIAGNOSIS — H900 Conductive hearing loss, bilateral: Secondary | ICD-10-CM | POA: Diagnosis not present

## 2023-05-19 DIAGNOSIS — C61 Malignant neoplasm of prostate: Secondary | ICD-10-CM | POA: Diagnosis not present

## 2023-05-23 IMAGING — RF DG ESOPHAGUS
18 of 22 series · 18 of 24 positions shown · non-contrast
Comparison: None

CLINICAL DATA: Dysphagia.

EXAM:
ESOPHOGRAM / BARIUM SWALLOW / BARIUM TABLET STUDY
TECHNIQUE: Combined double contrast and single contrast examination performed
using effervescent crystals, thick barium liquid, and thin barium
liquid. The patient was observed with fluoroscopy swallowing a 13 mm
barium sulphate tablet.
FLUOROSCOPY TIME:  Fluoroscopy Time:  3 minutes 54 seconds
Radiation Exposure Index (if provided by the fluoroscopic device):
44.1 mGy
Number of Acquired Spot Images: 1

[Series 1: cp_standard · 0.35mm/px · 1 of 43 frames shown (1 of 18)]
[frame 7/43]
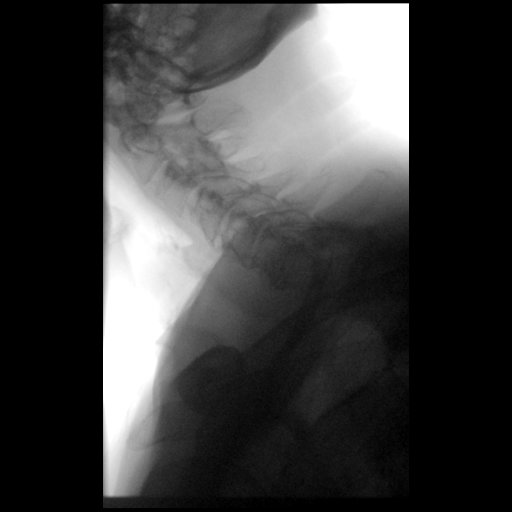

[Series 2: cp_standard · 0.35mm/px · 1 of 51 frames shown (2 of 18)]
[frame 44/51]
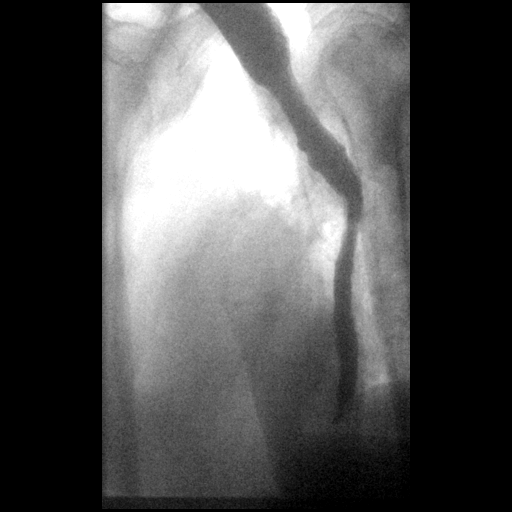

[Series 3: cp_standard · 0.36mm/px · 1 of 24 frames shown (3 of 18)]
[frame 24/24]
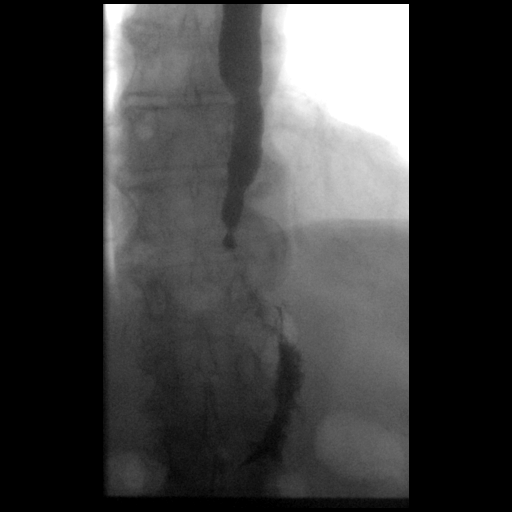

[Series 5: cp_standard · 0.36mm/px · 1 of 7 frames shown (4 of 18)]
[frame 1/7]
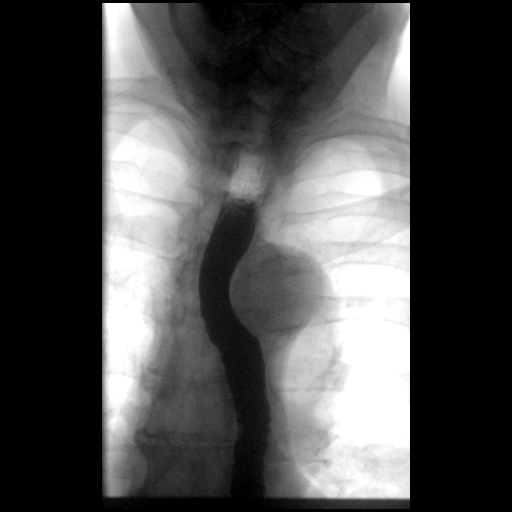

[Series 6: cp_standard · 0.36mm/px · 1 of 53 frames shown (5 of 18)]
[frame 46/53]
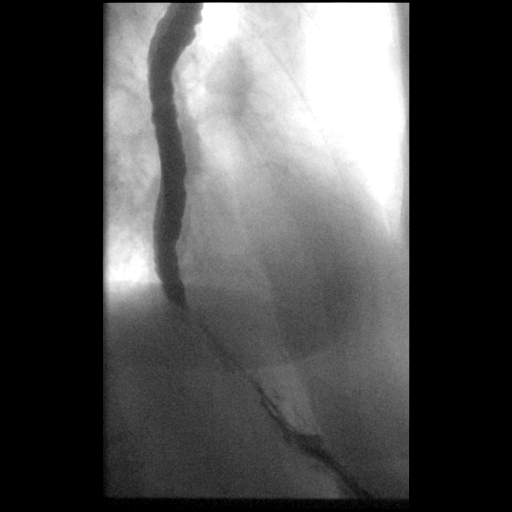

[Series 8: cp_standard · 0.37mm/px · 1 of 6 frames shown (6 of 18)]
[frame 6/6]
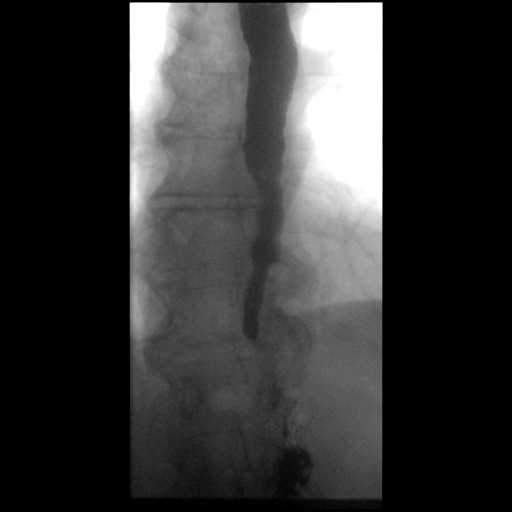

[Series 9: cp_standard · 0.36mm/px · 1 of 12 frames shown (7 of 18)]
[frame 11/12]
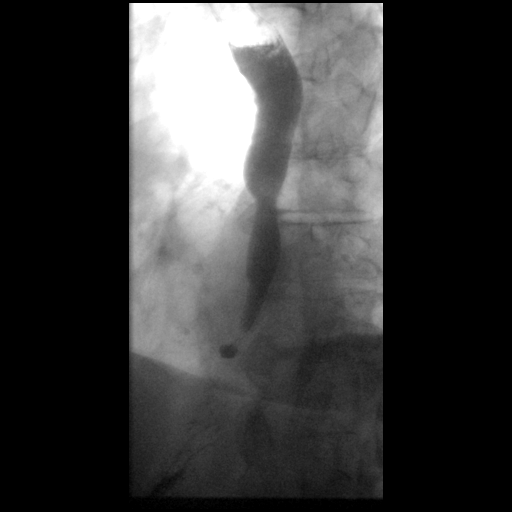

[Series 11: cp_standard · 0.36mm/px · 1 of 12 frames shown (8 of 18)]
[frame 12/12]
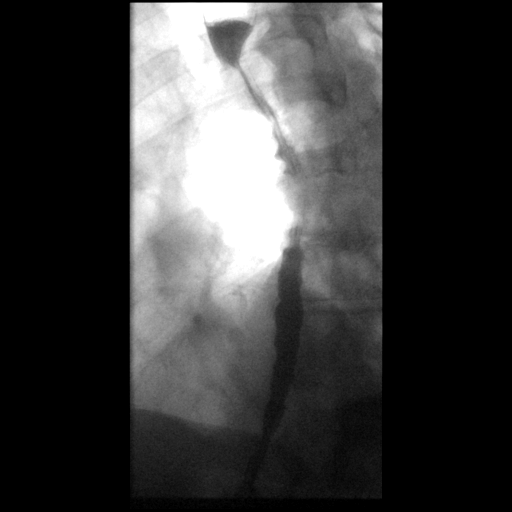

[Series 12: cp_standard · 0.36mm/px · 1 of 6 frames shown (9 of 18)]
[frame 6/6]
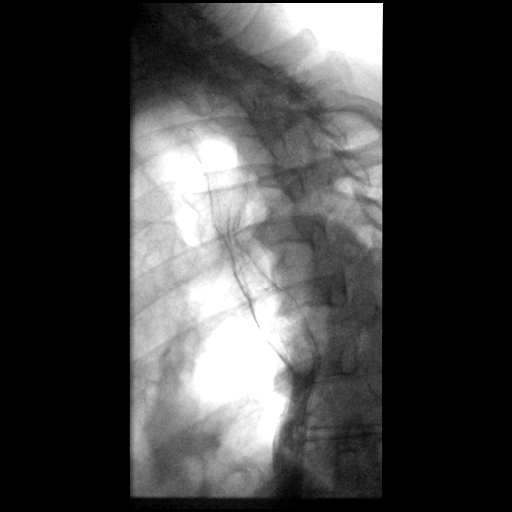

[Series 13: cp_standard · 0.37mm/px · 1 of 9 frames shown (10 of 18)]
[frame 8/9]
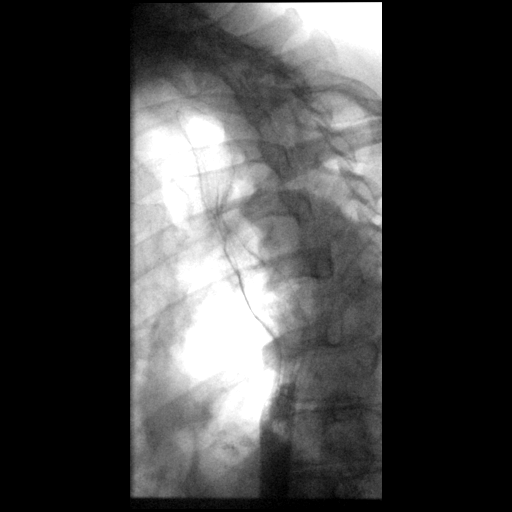

[Series 15: cp_standard · 0.36mm/px · 1 of 3 frames shown (11 of 18)]
[frame 3/3]
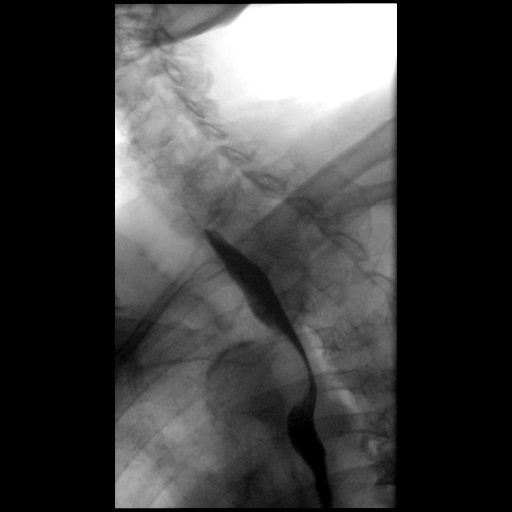

[Series 16: cp_standard · 0.36mm/px · 1 of 29 frames shown (12 of 18)]
[frame 29/29]
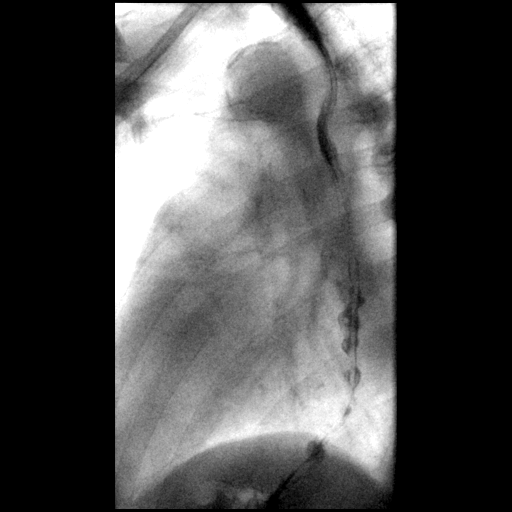

[Series 17: cp_standard · 0.37mm/px · 1 of 36 frames shown (13 of 18)]
[frame 19/36]
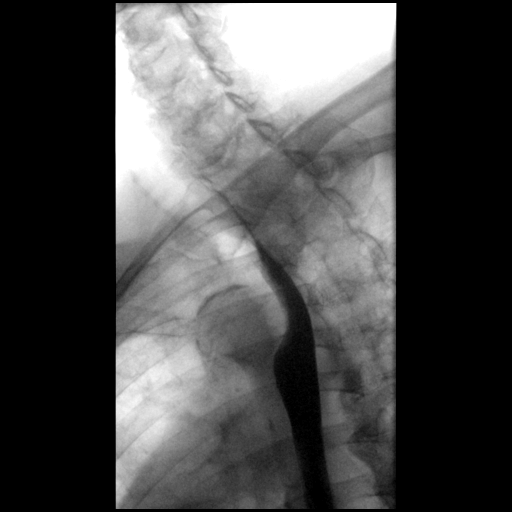

[Series 19: cp_standard · 0.37mm/px · 1 of 10 frames shown (14 of 18)]
[frame 9/10]
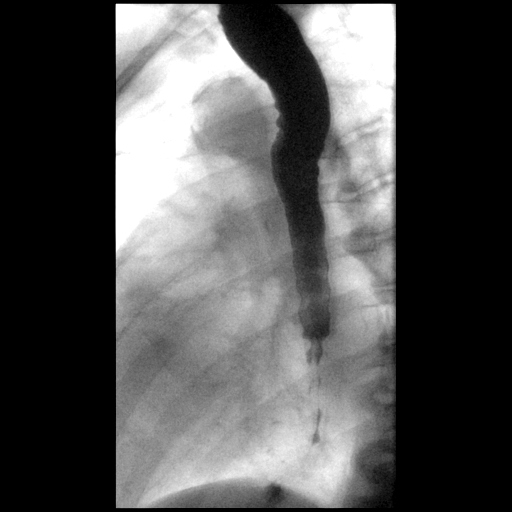

[Series 20: cp_standard · 0.37mm/px · 1 of 15 frames shown (15 of 18)]
[frame 13/15]
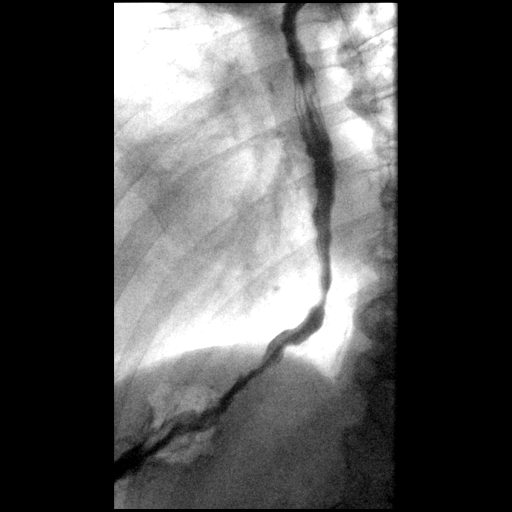

[Series 21: cp_standard · 0.37mm/px · 1 of 12 frames shown (16 of 18)]
[frame 11/12]
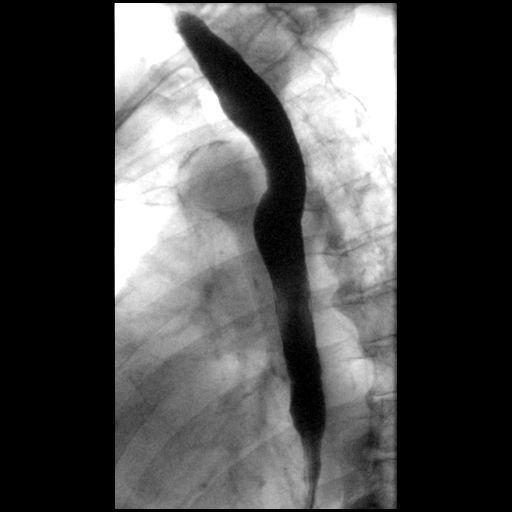

[Series 23: cp_standard · 0.37mm/px · 1 of 7 frames shown (17 of 18)]
[frame 6/7]
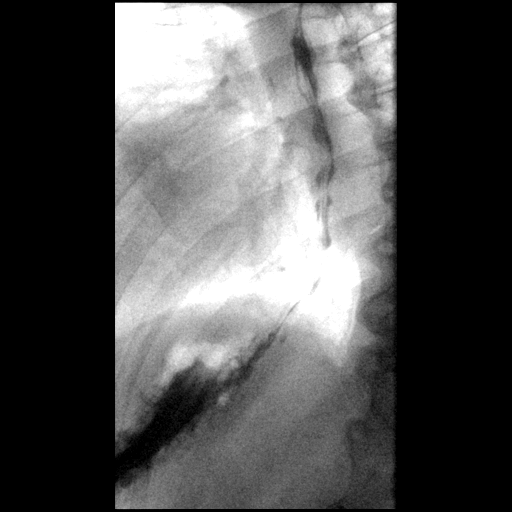

[Series 24: cp_standard · 0.37mm/px · 1 of 16 frames shown (18 of 18)]
[frame 14/16]
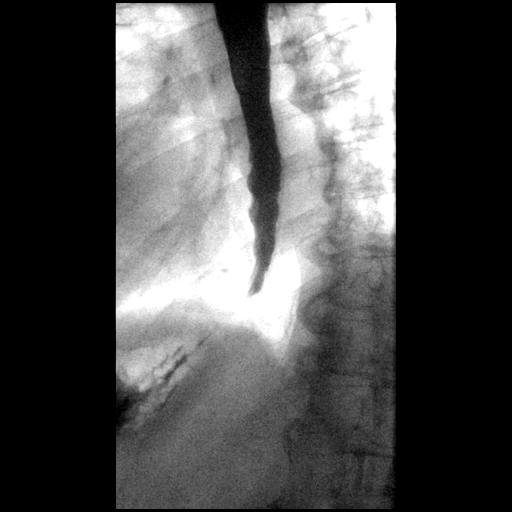

[18 of 24 positions shown; findings below may reference images not displayed]

FINDINGS: Swallowing was first performed in the lateral projection showing no
gross swallow dysfunction. No discomfort was reported with
swallowing.

Degenerative changes and accentuated lordotic curvature of the
cervical spine.

Esophagus was noted to be patulous after lateral and AP swallows
with narrowing distally and stasis throughout the esophagus in the
upright position. Tertiary peristaltic activity was noted also in
the upright and prone RAO position. Due to this significant stasis
double contrast was deferred. Aortic impression upon the esophagus
with similar contour of the mediastinum compared to previous
thoracic spine evaluations.

Stasis occurred mainly in the proximal esophagus without significant
transition at the level of the aorta.

Single swallow assessment showed limited primary wave with extensive
tertiary peristaltic activity and lack of distension distally just
above the GE junction of the distal esophagus despite attempts to
distend this area well with rapid golfing.

Pill swallowed without difficulty became halted at this area did not
pass after several minutes of observation despite swallows of thin
barium and water. No gross mass is seen in this area.
IMPRESSION: Marked esophageal dysmotility. Stasis in the esophagus on both
supine and upright position after swallowing.

Potential distal esophageal narrowing perhaps from stricture though
this area could not be well assessed or distended due to
dysmotility.

Gastroesophageal reflux was noted.

## 2023-05-29 DIAGNOSIS — I10 Essential (primary) hypertension: Secondary | ICD-10-CM | POA: Diagnosis not present

## 2023-05-29 DIAGNOSIS — F0280 Dementia in other diseases classified elsewhere without behavioral disturbance: Secondary | ICD-10-CM | POA: Diagnosis not present

## 2023-05-29 DIAGNOSIS — F3181 Bipolar II disorder: Secondary | ICD-10-CM | POA: Diagnosis not present

## 2023-05-29 DIAGNOSIS — G309 Alzheimer's disease, unspecified: Secondary | ICD-10-CM | POA: Diagnosis not present

## 2023-06-07 DIAGNOSIS — F02B4 Dementia in other diseases classified elsewhere, moderate, with anxiety: Secondary | ICD-10-CM | POA: Diagnosis not present

## 2023-06-07 DIAGNOSIS — C61 Malignant neoplasm of prostate: Secondary | ICD-10-CM | POA: Diagnosis not present

## 2023-06-07 DIAGNOSIS — G250 Essential tremor: Secondary | ICD-10-CM | POA: Diagnosis not present

## 2023-06-07 DIAGNOSIS — G309 Alzheimer's disease, unspecified: Secondary | ICD-10-CM | POA: Diagnosis not present

## 2023-06-07 DIAGNOSIS — I10 Essential (primary) hypertension: Secondary | ICD-10-CM | POA: Diagnosis not present

## 2023-06-07 DIAGNOSIS — F3181 Bipolar II disorder: Secondary | ICD-10-CM | POA: Diagnosis not present
